# Patient Record
Sex: Female | Born: 1980 | Race: White | Hispanic: No | Marital: Married | Smoking: Never smoker
Health system: Southern US, Community
[De-identification: ages and names within clinical notes are randomized; demographics above are authoritative.]

## PROBLEM LIST (undated history)

## (undated) ENCOUNTER — Emergency Department (HOSPITAL_COMMUNITY): Discharge status: Absent without leave | Payer: 59

## (undated) DIAGNOSIS — Z8619 Personal history of other infectious and parasitic diseases: Secondary | ICD-10-CM

## (undated) DIAGNOSIS — Z789 Other specified health status: Secondary | ICD-10-CM

## (undated) DIAGNOSIS — O99119 Other diseases of the blood and blood-forming organs and certain disorders involving the immune mechanism complicating pregnancy, unspecified trimester: Secondary | ICD-10-CM

## (undated) DIAGNOSIS — Z905 Acquired absence of kidney: Secondary | ICD-10-CM

## (undated) DIAGNOSIS — D696 Thrombocytopenia, unspecified: Secondary | ICD-10-CM

## (undated) HISTORY — PX: NO PAST SURGERIES: SHX2092

## (undated) HISTORY — DX: Acquired absence of kidney: Z90.5

## (undated) HISTORY — DX: Personal history of other infectious and parasitic diseases: Z86.19

## (undated) HISTORY — DX: Other diseases of the blood and blood-forming organs and certain disorders involving the immune mechanism complicating pregnancy, unspecified trimester: O99.119

## (undated) HISTORY — DX: Thrombocytopenia, unspecified: D69.6

## (undated) HISTORY — PX: TONSILLECTOMY: SUR1361

---
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
## 2006-12-10 ENCOUNTER — Encounter: Admission: RE | Admit: 2006-12-10 | Discharge: 2006-12-10 | Payer: Self-pay | Admitting: Obstetrics and Gynecology

## 2009-07-31 ENCOUNTER — Inpatient Hospital Stay (HOSPITAL_COMMUNITY): Admission: AD | Admit: 2009-07-31 | Discharge: 2009-08-04 | Payer: Self-pay | Admitting: Obstetrics & Gynecology

## 2010-06-24 LAB — CBC
HCT: 30.5 % — ABNORMAL LOW (ref 36.0–46.0)
Hemoglobin: 10.5 g/dL — ABNORMAL LOW (ref 12.0–15.0)
Hemoglobin: 12.5 g/dL (ref 12.0–15.0)
MCHC: 34.7 g/dL (ref 30.0–36.0)
MCHC: 35.2 g/dL (ref 30.0–36.0)
MCV: 100.1 fL — ABNORMAL HIGH (ref 78.0–100.0)
MCV: 97.7 fL (ref 78.0–100.0)
MCV: 99.3 fL (ref 78.0–100.0)
Platelets: 90 10*3/uL — ABNORMAL LOW (ref 150–400)
Platelets: 94 10*3/uL — ABNORMAL LOW (ref 150–400)
RBC: 3.05 MIL/uL — ABNORMAL LOW (ref 3.87–5.11)
RBC: 3.5 MIL/uL — ABNORMAL LOW (ref 3.87–5.11)
RDW: 12.5 % (ref 11.5–15.5)
RDW: 12.7 % (ref 11.5–15.5)
WBC: 12.6 10*3/uL — ABNORMAL HIGH (ref 4.0–10.5)
WBC: 12.8 10*3/uL — ABNORMAL HIGH (ref 4.0–10.5)
WBC: 12.9 10*3/uL — ABNORMAL HIGH (ref 4.0–10.5)

## 2010-06-24 LAB — URINALYSIS, DIPSTICK ONLY
Hgb urine dipstick: NEGATIVE
Leukocytes, UA: NEGATIVE
Nitrite: NEGATIVE
Specific Gravity, Urine: 1.01 (ref 1.005–1.030)
Urobilinogen, UA: 0.2 mg/dL (ref 0.0–1.0)

## 2010-11-24 ENCOUNTER — Other Ambulatory Visit: Payer: Self-pay

## 2010-11-24 LAB — ANTIBODY SCREEN: Antibody Screen: NEGATIVE

## 2010-11-24 LAB — CBC: Platelets: 194 10*3/uL (ref 150–399)

## 2010-11-24 LAB — GC/CHLAMYDIA PROBE AMP, GENITAL: Chlamydia: NEGATIVE

## 2010-12-11 ENCOUNTER — Other Ambulatory Visit: Payer: Self-pay

## 2011-02-05 ENCOUNTER — Other Ambulatory Visit (HOSPITAL_COMMUNITY): Payer: Self-pay | Admitting: Obstetrics & Gynecology

## 2011-02-05 DIAGNOSIS — IMO0002 Reserved for concepts with insufficient information to code with codable children: Secondary | ICD-10-CM

## 2011-02-06 ENCOUNTER — Encounter (HOSPITAL_COMMUNITY): Payer: Self-pay

## 2011-02-06 ENCOUNTER — Ambulatory Visit (HOSPITAL_COMMUNITY)
Admission: RE | Admit: 2011-02-06 | Discharge: 2011-02-06 | Disposition: A | Discharge status: Home / Self Care | Payer: BC Managed Care – PPO | Source: Ambulatory Visit | Attending: Obstetrics & Gynecology | Admitting: Obstetrics & Gynecology

## 2011-02-06 DIAGNOSIS — Z1389 Encounter for screening for other disorder: Secondary | ICD-10-CM | POA: Insufficient documentation

## 2011-02-06 DIAGNOSIS — O3500X0 Maternal care for (suspected) central nervous system malformation or damage in fetus, unspecified, not applicable or unspecified: Secondary | ICD-10-CM | POA: Insufficient documentation

## 2011-02-06 DIAGNOSIS — O358XX0 Maternal care for other (suspected) fetal abnormality and damage, not applicable or unspecified: Secondary | ICD-10-CM | POA: Insufficient documentation

## 2011-02-06 DIAGNOSIS — Z363 Encounter for antenatal screening for malformations: Secondary | ICD-10-CM | POA: Insufficient documentation

## 2011-02-06 DIAGNOSIS — IMO0002 Reserved for concepts with insufficient information to code with codable children: Secondary | ICD-10-CM

## 2011-02-06 DIAGNOSIS — O350XX0 Maternal care for (suspected) central nervous system malformation in fetus, not applicable or unspecified: Secondary | ICD-10-CM | POA: Insufficient documentation

## 2011-04-07 NOTE — L&D Delivery Note (Signed)
Delivery Note At 3:56 PM a viable and healthy female was delivered via Vaginal, Spontaneous Delivery (Presentation: Right Occiput Anterior).  APGAR: 9, 9; weight  7 lb 8 oz.  Placenta status: Intact, Spontaneous.  Cord: 3 vessels, loose nuchal cord, reduced before head delivery. complications: None.  Cord pH: none needed.   Anesthesia: Epidural  Episiotomy: None  Lacerations: 1st degree;Vaginal Suture Repair: 3.0 vicryl rapide Est. Blood Loss (mL): 300 cc  Mom to postpartum.  Baby to with mother.  Cynara Tatham R 06/28/2011, 4:26 PM

## 2011-04-09 ENCOUNTER — Ambulatory Visit (HOSPITAL_COMMUNITY): Payer: BC Managed Care – PPO

## 2011-04-15 ENCOUNTER — Encounter: Payer: BC Managed Care – PPO | Attending: Obstetrics & Gynecology

## 2011-04-15 DIAGNOSIS — O99810 Abnormal glucose complicating pregnancy: Secondary | ICD-10-CM | POA: Insufficient documentation

## 2011-04-15 DIAGNOSIS — Z713 Dietary counseling and surveillance: Secondary | ICD-10-CM | POA: Insufficient documentation

## 2011-04-19 NOTE — Progress Notes (Signed)
  Patient was seen on 04/15/2011 for Gestational Diabetes self-management class at the [facility]. The following learning objectives were met by the patient during this course:   States the definition of Gestational Diabetes  States why dietary management is important in controlling blood glucose  Describes the effects each nutrient has on blood glucose levels  Demonstrates ability to create a balanced meal plan  Demonstrates carbohydrate counting   States when to check blood glucose levels  Demonstrates proper blood glucose monitoring techniques  States the effect of stress and exercise on blood glucose levels  States the importance of limiting caffeine and abstaining from alcohol and smoking  Blood glucose monitor given: One Touch Ultra Mini Meter Kit Lot # [id number] X Exp: 07/2011 Blood glucose reading: 91 mg/dl  Patient instructed to monitor glucose levels: FBS: 60 - <90 1 hour: <140 2 hour: <120  Patient received handouts:  Nutrition Diabetes and Pregnancy  Carbohydrate Counting List  Patient will be seen for follow-up as needed.

## 2011-04-19 NOTE — Patient Instructions (Signed)
Goals:  Check glucose levels per MD as instructed  Follow Gestational Diabetes Diet as instructed  Call for follow-up as needed    

## 2011-06-04 LAB — STREP B DNA PROBE: GBS: NEGATIVE

## 2011-06-28 ENCOUNTER — Inpatient Hospital Stay (HOSPITAL_COMMUNITY): Payer: BC Managed Care – PPO | Admitting: Anesthesiology

## 2011-06-28 ENCOUNTER — Encounter (HOSPITAL_COMMUNITY): Payer: Self-pay | Admitting: Obstetrics and Gynecology

## 2011-06-28 ENCOUNTER — Encounter (HOSPITAL_COMMUNITY): Payer: Self-pay | Admitting: Anesthesiology

## 2011-06-28 ENCOUNTER — Inpatient Hospital Stay (HOSPITAL_COMMUNITY)
Admission: AD | Admit: 2011-06-28 | Discharge: 2011-06-30 | DRG: 372 | Disposition: A | Discharge status: Home / Self Care | Payer: BC Managed Care – PPO | Source: Ambulatory Visit | Attending: Obstetrics and Gynecology | Admitting: Obstetrics and Gynecology

## 2011-06-28 DIAGNOSIS — O9912 Other diseases of the blood and blood-forming organs and certain disorders involving the immune mechanism complicating childbirth: Secondary | ICD-10-CM | POA: Diagnosis present

## 2011-06-28 DIAGNOSIS — D689 Coagulation defect, unspecified: Secondary | ICD-10-CM | POA: Diagnosis present

## 2011-06-28 DIAGNOSIS — O99119 Other diseases of the blood and blood-forming organs and certain disorders involving the immune mechanism complicating pregnancy, unspecified trimester: Secondary | ICD-10-CM | POA: Diagnosis present

## 2011-06-28 DIAGNOSIS — O24410 Gestational diabetes mellitus in pregnancy, diet controlled: Secondary | ICD-10-CM | POA: Diagnosis present

## 2011-06-28 DIAGNOSIS — O99814 Abnormal glucose complicating childbirth: Principal | ICD-10-CM | POA: Diagnosis present

## 2011-06-28 DIAGNOSIS — IMO0001 Reserved for inherently not codable concepts without codable children: Secondary | ICD-10-CM

## 2011-06-28 DIAGNOSIS — D696 Thrombocytopenia, unspecified: Secondary | ICD-10-CM | POA: Diagnosis present

## 2011-06-28 HISTORY — DX: Other specified health status: Z78.9

## 2011-06-28 LAB — CBC
HCT: 40 % (ref 36.0–46.0)
HCT: 36.7 % (ref 36.0–46.0)
Hemoglobin: 13.5 g/dL (ref 12.0–15.0)
MCH: 33 pg (ref 26.0–34.0)
MCHC: 33.8 g/dL (ref 30.0–36.0)
RBC: 3.78 MIL/uL — ABNORMAL LOW (ref 3.87–5.11)
RDW: 13.4 % (ref 11.5–15.5)
WBC: 20.2 10*3/uL — ABNORMAL HIGH (ref 4.0–10.5)

## 2011-06-28 LAB — ABO/RH: ABO/RH(D): O POS

## 2011-06-28 MED ORDER — OXYCODONE-ACETAMINOPHEN 5-325 MG PO TABS: 1.0000 | ORAL_TABLET | ORAL | Status: DC | PRN

## 2011-06-28 MED ORDER — BENZOCAINE-MENTHOL 20-0.5 % EX AERO: 1.0000 "application " | INHALATION_SPRAY | CUTANEOUS | Status: DC | PRN

## 2011-06-28 MED ORDER — ZOLPIDEM TARTRATE 5 MG PO TABS: 5.0000 mg | ORAL_TABLET | Freq: Every evening | ORAL | Status: DC | PRN

## 2011-06-28 MED ORDER — IBUPROFEN 600 MG PO TABS
600.0000 mg | ORAL_TABLET | Freq: Four times a day (QID) | ORAL | Status: DC
  Administered 2011-06-28 – 2011-06-29 (×3): 600 mg via ORAL
  Filled 2011-06-28 (×3): qty 1

## 2011-06-28 MED ORDER — LANOLIN HYDROUS EX OINT: TOPICAL_OINTMENT | CUTANEOUS | Status: DC | PRN

## 2011-06-28 MED ORDER — SIMETHICONE 80 MG PO CHEW: 80.0000 mg | CHEWABLE_TABLET | ORAL | Status: DC | PRN

## 2011-06-28 MED ORDER — OXYTOCIN 20 UNITS IN LACTATED RINGERS INFUSION - SIMPLE
125.0000 mL/h | Freq: Once | INTRAVENOUS | Status: AC
  Administered 2011-06-28: 125 mL/h via INTRAVENOUS

## 2011-06-28 MED ORDER — IBUPROFEN 600 MG PO TABS: 600.0000 mg | ORAL_TABLET | Freq: Four times a day (QID) | ORAL | Status: DC | PRN

## 2011-06-28 MED ORDER — TETANUS-DIPHTH-ACELL PERTUSSIS 5-2.5-18.5 LF-MCG/0.5 IM SUSP
0.5000 mL | Freq: Once | INTRAMUSCULAR | Status: AC
  Administered 2011-06-29: 0.5 mL via INTRAMUSCULAR
  Filled 2011-06-28: qty 0.5

## 2011-06-28 MED ORDER — CITRIC ACID-SODIUM CITRATE 334-500 MG/5ML PO SOLN: 30.0000 mL | ORAL | Status: DC | PRN

## 2011-06-28 MED ORDER — EPHEDRINE 5 MG/ML INJ
10.0000 mg | INTRAVENOUS | Status: DC | PRN
  Filled 2011-06-28: qty 4

## 2011-06-28 MED ORDER — LACTATED RINGERS IV SOLN: 500.0000 mL | Freq: Once | INTRAVENOUS | Status: DC

## 2011-06-28 MED ORDER — ONDANSETRON HCL 4 MG/2ML IJ SOLN: 4.0000 mg | INTRAMUSCULAR | Status: DC | PRN

## 2011-06-28 MED ORDER — LACTATED RINGERS IV SOLN: 500.0000 mL | INTRAVENOUS | Status: DC | PRN

## 2011-06-28 MED ORDER — PHENYLEPHRINE 40 MCG/ML (10ML) SYRINGE FOR IV PUSH (FOR BLOOD PRESSURE SUPPORT)
80.0000 ug | PREFILLED_SYRINGE | INTRAVENOUS | Status: DC | PRN
  Filled 2011-06-28: qty 5

## 2011-06-28 MED ORDER — DIPHENHYDRAMINE HCL 25 MG PO CAPS: 25.0000 mg | ORAL_CAPSULE | Freq: Four times a day (QID) | ORAL | Status: DC | PRN

## 2011-06-28 MED ORDER — OXYTOCIN BOLUS FROM INFUSION
500.0000 mL | Freq: Once | INTRAVENOUS | Status: DC
  Filled 2011-06-28: qty 1000
  Filled 2011-06-28: qty 500

## 2011-06-28 MED ORDER — DIPHENHYDRAMINE HCL 50 MG/ML IJ SOLN: 12.5000 mg | INTRAMUSCULAR | Status: DC | PRN

## 2011-06-28 MED ORDER — ONDANSETRON HCL 4 MG/2ML IJ SOLN: 4.0000 mg | Freq: Four times a day (QID) | INTRAMUSCULAR | Status: DC | PRN

## 2011-06-28 MED ORDER — ACETAMINOPHEN 325 MG PO TABS: 650.0000 mg | ORAL_TABLET | ORAL | Status: DC | PRN

## 2011-06-28 MED ORDER — WITCH HAZEL-GLYCERIN EX PADS: 1.0000 "application " | MEDICATED_PAD | CUTANEOUS | Status: DC | PRN

## 2011-06-28 MED ORDER — SENNOSIDES-DOCUSATE SODIUM 8.6-50 MG PO TABS
2.0000 | ORAL_TABLET | Freq: Every day | ORAL | Status: DC
  Administered 2011-06-28 – 2011-06-29 (×2): 2 via ORAL

## 2011-06-28 MED ORDER — EPHEDRINE 5 MG/ML INJ: 10.0000 mg | INTRAVENOUS | Status: DC | PRN

## 2011-06-28 MED ORDER — DIBUCAINE 1 % RE OINT: 1.0000 "application " | TOPICAL_OINTMENT | RECTAL | Status: DC | PRN

## 2011-06-28 MED ORDER — LIDOCAINE HCL (PF) 1 % IJ SOLN: 30.0000 mL | INTRAMUSCULAR | Status: DC | PRN

## 2011-06-28 MED ORDER — ONDANSETRON HCL 4 MG PO TABS: 4.0000 mg | ORAL_TABLET | ORAL | Status: DC | PRN

## 2011-06-28 MED ORDER — FENTANYL 2.5 MCG/ML BUPIVACAINE 1/10 % EPIDURAL INFUSION (WH - ANES)
14.0000 mL/h | INTRAMUSCULAR | Status: DC
  Filled 2011-06-28: qty 60

## 2011-06-28 MED ORDER — PRENATAL MULTIVITAMIN CH
1.0000 | ORAL_TABLET | Freq: Every day | ORAL | Status: DC
  Administered 2011-06-29 – 2011-06-30 (×2): 1 via ORAL
  Filled 2011-06-28 (×2): qty 1

## 2011-06-28 MED ORDER — SODIUM BICARBONATE 8.4 % IV SOLN
INTRAVENOUS | Status: DC | PRN
  Administered 2011-06-28: 4 mL via EPIDURAL

## 2011-06-28 MED ORDER — LACTATED RINGERS IV SOLN
INTRAVENOUS | Status: DC
  Administered 2011-06-28 (×2): via INTRAVENOUS

## 2011-06-28 MED ORDER — FENTANYL 2.5 MCG/ML BUPIVACAINE 1/10 % EPIDURAL INFUSION (WH - ANES)
INTRAMUSCULAR | Status: DC | PRN
  Administered 2011-06-28: 14 mL/h via EPIDURAL

## 2011-06-28 MED ORDER — PHENYLEPHRINE 40 MCG/ML (10ML) SYRINGE FOR IV PUSH (FOR BLOOD PRESSURE SUPPORT): 80.0000 ug | PREFILLED_SYRINGE | INTRAVENOUS | Status: DC | PRN

## 2011-06-28 MED ORDER — FLEET ENEMA 7-19 GM/118ML RE ENEM: 1.0000 | ENEMA | RECTAL | Status: DC | PRN

## 2011-06-28 NOTE — Anesthesia Procedure Notes (Signed)

## 2011-06-28 NOTE — H&P (Signed)
Nancy Colon is a 31 y.o. female presenting for Active labor, no loss of fluid or bleeding.. Maternal Medical History:  Reason for admission: Reason for admission: contractions.  Contractions: Perceived severity is moderate.    Fetal activity: Perceived fetal activity is normal.    Prenatal complications: Thrombocytopenia.   Prenatal Complications - Diabetes: gestational. Diabetes is managed by diet.     [facility] at [facility] started at 8 wks with Dr Juliene Pina. Good wt gain, GDM, well controlled, sono with good interval growth, last on 06/19/11 EFW 6'15" Prior [place]- uncomplicated SVD, has Gest.thromobocytopenia (90K) was able to get epidural. 7+ lbs, girl. PMHx- neg, KNDAs, non smoker/illicits.  .  OB History    Grav Para Term Preterm Abortions TAB SAB Ect Mult Living   2 1 1  0 0 0 0 0 0 1     Past Medical History  Diagnosis Date  . No pertinent past medical history    Past Surgical History  Procedure Date  . Tonsillectomy   . No past surgeries    Family History: family history is not on file. Social History:  reports that she has never smoked. She has never used smokeless tobacco. She reports that she does not drink alcohol or use illicit drugs.  Review of Systems  Constitutional: Negative for fever.  Eyes: Negative for blurred vision.  Respiratory: Negative for shortness of breath.   Cardiovascular: Negative for chest pain.  Neurological: Negative for headaches.  Psychiatric/Behavioral: Negative for depression.    Dilation: 5 Effacement (%): 80 Station: -2 Exam by:: J. Rasch RN  Blood pressure 121/75, pulse 74, temperature 98.3 F (36.8 C), temperature source Oral, resp. rate 20, height 5\' 3"  (1.6 m), weight 58.968 kg (130 lb), last menstrual period 09/26/2010. Exam Physical Exam  Physical exam:  A&O x 3, no acute distress. Pleasant HEENT neg, no thyromegaly Lungs CTA bilat CV RRR, S1S2 normal Abdo soft, non tender, non acute Extr no edema/ tenderness Pelvic above FHT   140s/+ accels/ no decels/ mod variab. Category I Toco q 3 min.   Prenatal labs: ABO, Rh: O/Positive/-- (08/20 0000) Antibody: Negative (08/20 0000) Rubella: Immune (08/20 0000) RPR: Nonreactive (08/20 0000)  HBsAg: Negative (08/20 0000)  HIV: Non-reactive (08/20 0000)  GBS: Negative (02/28 0000)  3 hr GTT- abn, but excellent diet controlled GDM (A1).  Sono - 18 wk noted bilateral CPC and echogenic focus in RUQ, but resolved at 26 wk sono. Ultrascreen neg, AFP1 neg/   Assessment/Plan: 31 yo, G2P1001, 39.2/7 wks, active labor. Epidural desired. GBS neg. Admit, watch labor progress, AROM after epidural, platelet ct pending.    Nancy Colon R 06/28/2011, 12:12 PM

## 2011-06-28 NOTE — Anesthesia Preprocedure Evaluation (Signed)

## 2011-06-28 NOTE — Progress Notes (Signed)
Nancy Colon is a 31 y.o. G2P1001 at [redacted]w[redacted]d, well dated, here is active labor. A1GDM, well controlled. Thrombocytopenia, platelets 101, s/p epidural, working well.   Objective: BP 104/71  Pulse 89  Temp(Src) 98.3 F (36.8 C) (Oral)  Resp 20  Ht 5\' 3"  (1.6 m)  Wt 58.968 kg (130 lb)  BMI 23.03 kg/m2  SpO2 98%  LMP 09/26/2010    FHT:  FHR: 140 bpm, variability: moderate,  accelerations:  Present,  decelerations:  Absent UC:   none, regular, every SVE:   Dilation: 6 Effacement (%): 100 Station: -1;-2 Exam by:: Dr. Juliene Pina AROM, clear fluid.   Labs: Lab Results  Component Value Date   WBC 17.7* 06/28/2011   HGB 13.5 06/28/2011   HCT 40.0 06/28/2011   MCV 97.8 06/28/2011   PLT 101* 06/28/2011   Assessment / Plan: Spontaneous labor, progressing normally, EFW 7 lbs.  GDM- A1, do random BS now.  Labor: Progressing normally Fetal Wellbeing:  Category I Pain Control:  Epidural Anticipated MOD:  NSVD  Nancy Colon 06/28/2011, 2:24 PM

## 2011-06-28 NOTE — Anesthesia Postprocedure Evaluation (Signed)
  Anesthesia Post-op Note  Patient: Nancy Colon  Procedure(s) Performed: * No procedures listed *  Patient Location: PACU and Mother/Baby  Anesthesia Type: Epidural  Level of Consciousness: awake, alert  and oriented  Airway and Oxygen Therapy: Patient Spontanous Breathing   Post-op Assessment: Patient's Cardiovascular Status Stable and Respiratory Function Stable  Post-op Vital Signs: stable  Complications: No apparent anesthesia complications

## 2011-06-29 LAB — CBC
HCT: 32.6 % — ABNORMAL LOW (ref 36.0–46.0)
Hemoglobin: 10.8 g/dL — ABNORMAL LOW (ref 12.0–15.0)
MCH: 32.4 pg (ref 26.0–34.0)
RBC: 3.33 MIL/uL — ABNORMAL LOW (ref 3.87–5.11)

## 2011-06-29 NOTE — Progress Notes (Signed)
Spoke with [facility]. Epidural needs to be removed within six hours of CBC. RN will re-evaluate PLT level after 0500 CBC then notify anesthesia of level before removal of epidural. Pt notified and verbalized understanding.

## 2011-06-29 NOTE — Progress Notes (Signed)
Spoke with L&D nurse. She stated she spoke with anesthesia and ok to remove epidural. L&D will send a RN out to remove epidural.

## 2011-06-29 NOTE — Progress Notes (Addendum)
Patient ID: Nancy Colon, female   DOB: [birth year], 31 y.o.   MRN: [id number]  PPD 1 SVD  S:  Reports feeling well             Tolerating po/ No nausea or vomiting             Bleeding is moderate             Pain controlled withmotrin (stopped this am) and percocet             Up ad lib / ambulatory  Newborn breast feeding  / females newborn   O:  A & O x 3 NAD             VS: Blood pressure 98/63, pulse 66, temperature 98.1 F (36.7 C), temperature source Oral, resp. rate 18, height 5\' 3"  (1.6 m), weight 58.968 kg (130 lb), last menstrual period 09/26/2010, SpO2 98.00%, unknown if currently breastfeeding.   LABS: WBC/Hgb/Hct/Plts:  11.3/10.8/32.6/99 (03/25 0554)     Lungs: Clear and unlabored  Heart: regular rate and rhythm / no mumurs  Abdomen: soft, non-tender, non-distended              Fundus: firm, non-tender, U-1  Perineum: mild edema  Lochia: light  Extremities: no edema, no calf pain or tenderness    A: PPD # 1             Gestational thrombocytopenia   Doing well - stable status  P:  Routine post partum orders  NSAIDS held - recheck PLT tomorrow  Marlinda Mike 06/29/2011, 8:40 AM  MD note Reviewed, pt seen, agree with above note and plan. --V.Juliene Pina, Md

## 2011-06-30 LAB — CBC
HCT: 32.1 % — ABNORMAL LOW (ref 36.0–46.0)
Hemoglobin: 10.8 g/dL — ABNORMAL LOW (ref 12.0–15.0)
MCH: 33.1 pg (ref 26.0–34.0)
MCHC: 33.6 g/dL (ref 30.0–36.0)
MCV: 98.5 fL (ref 78.0–100.0)
Platelets: 99 10*3/uL — ABNORMAL LOW (ref 150–400)
RBC: 3.26 MIL/uL — ABNORMAL LOW (ref 3.87–5.11)
RDW: 13.5 % (ref 11.5–15.5)
WBC: 10.5 10*3/uL (ref 4.0–10.5)

## 2011-06-30 NOTE — Progress Notes (Signed)
Patient ID: Nancy Colon, female   DOB: 1980-09-14, 31 y.o.   MRN: [id number]  PPD 1 SVD  S:  Reports feeling well             Tolerating po/ No nausea or vomiting             Bleeding is moderate             Pain minimal, no meds needed             Up ad lib / ambulatory  Newborn breast feeding  / females newborn   O:  A & O x 3 NAD             VS: Blood pressure 99/64, pulse 67, temperature 98.1 F (36.7 C), temperature source Oral, resp. rate 18, height 5\' 3"  (1.6 m), weight 58.968 kg (130 lb), last menstrual period 09/26/2010, SpO2 98.00%, unknown if currently breastfeeding.   LABS:  Lab Results  Component Value Date   PLT 99* 06/30/2011       Lungs: Clear and unlabored  Heart: regular rate and rhythm / no mumurs  Abdomen: soft, non-tender, non-distended              Fundus: firm, non-tender, U-1  Perineum: mild edema  Lochia: light  Extremities: no edema, no calf pain or tenderness    A: PPD # 1             Gestational thrombocytopenia   Doing well - stable status  P:  Routine post partum orders  D/C home today  F/U platelet count at 6 wks PP  Bleeding precautions reviewed, no NSAIDs during PP  Plan reviewed w/ Dr. Reyne Dumas 06/30/2011, 9:01 AM

## 2011-06-30 NOTE — Discharge Summary (Signed)
Reviewed, plan discussed, agree with above note and plan, f/up 6 wks, bleeding precautions.

## 2011-06-30 NOTE — Discharge Summary (Signed)
Obstetric Discharge Summary Reason for Admission: onset of labor and gestational diabetes A1, gestational thrombocytopenia Prenatal Procedures: ultrasound Intrapartum Procedures: spontaneous vaginal delivery Postpartum Procedures: none Complications-Operative and Postpartum: 1st degree vaginal laceration Hemoglobin  Date Value Range Status  06/30/2011 10.8* 12.0-15.0 (g/dL) Final     HCT  Date Value Range Status  06/30/2011 32.1* 36.0-46.0 (%) Final    Physical Exam:  General: alert, cooperative and no distress Lochia: appropriate Uterine Fundus: firm Incision: healing well, no dehiscence DVT Evaluation: No evidence of DVT seen on physical exam. Negative Homan's sign.  Discharge Diagnoses: Term Pregnancy-delivered and gestational thrombocytopenia - stable  Discharge Information: Date: 06/30/2011 Activity: pelvic rest Diet: routine Medications: PNV Condition: stable Instructions: refer to practice specific booklet Discharge to: home Follow-up Information    Follow up with MODY,Nancy R, MD. Schedule an appointment as soon as possible for a visit in 6 weeks.   Contact information:   [address] [place] [address] [phone]          Newborn Data: Live born female  Birth Weight: 7 lb 8.3 oz (3410 g) APGAR: 9, 9  Home with mother.  Nancy Colon 06/30/2011, 9:05 AM

## 2012-08-03 IMAGING — US US OB DETAIL+14 WK
1 series · 14 of 28 positions shown · non-contrast
Comparison: none

[Series 1: us ob detail+14 wk · 0.19mm/px · 14 of 98 slices shown]
[im 4/98]
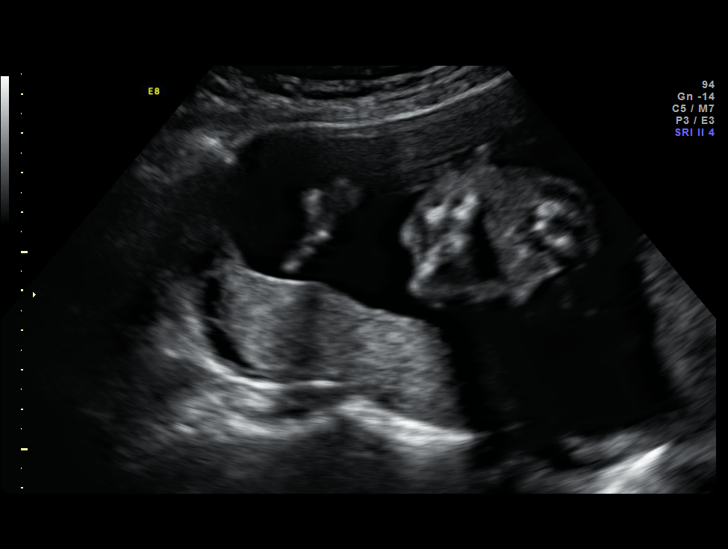
[im 11/98]
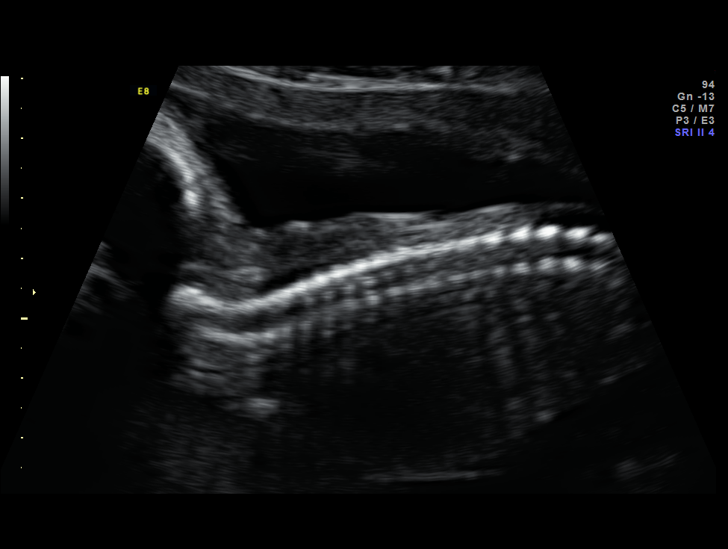
[im 18/98]
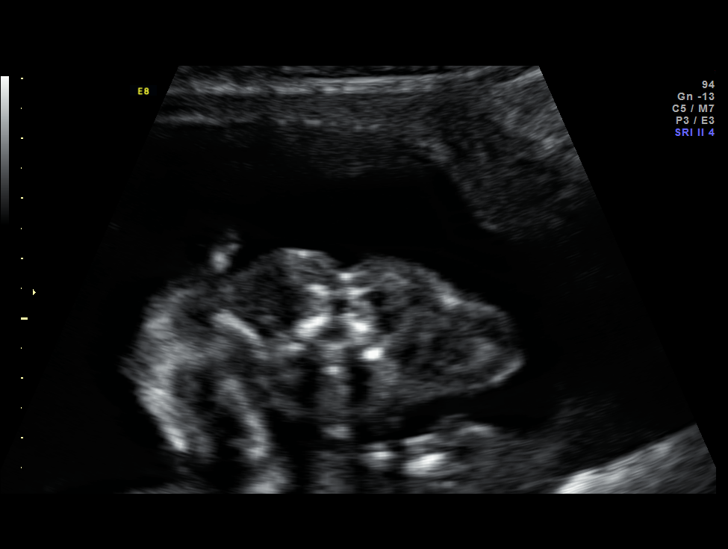
[im 26/98]
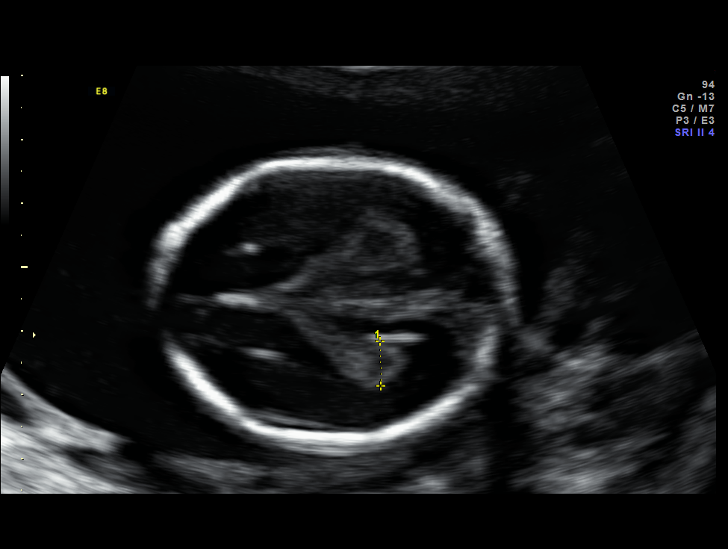
[im 33/98]
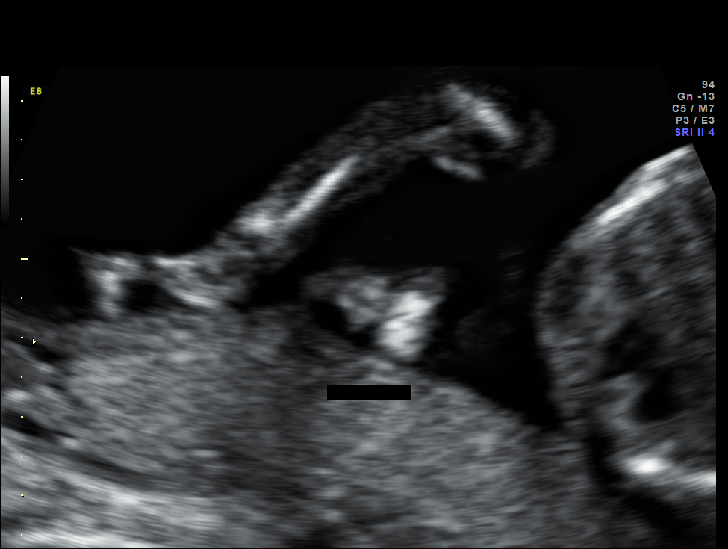
[im 40/98]
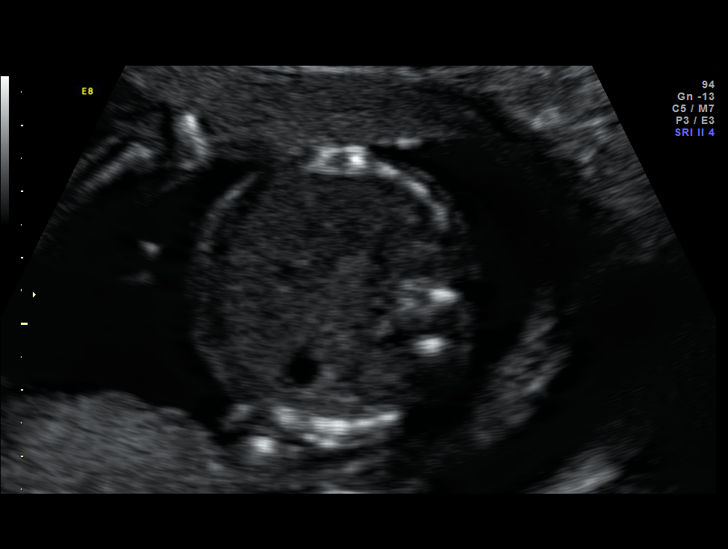
[im 47/98]
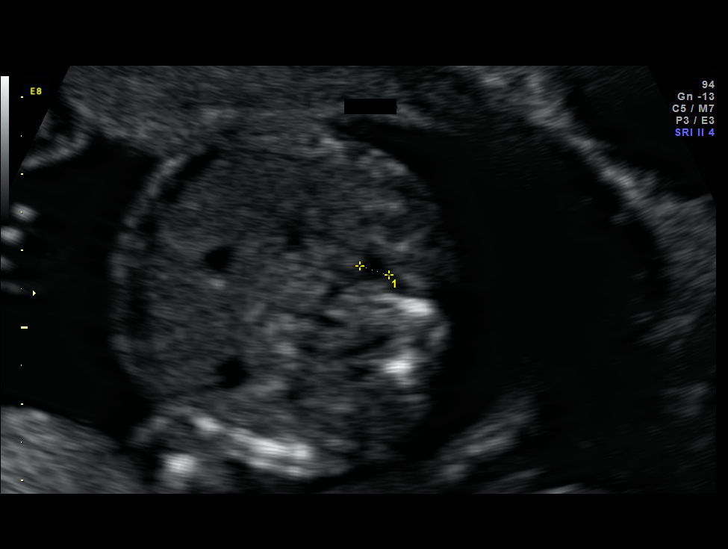
[im 54/98]
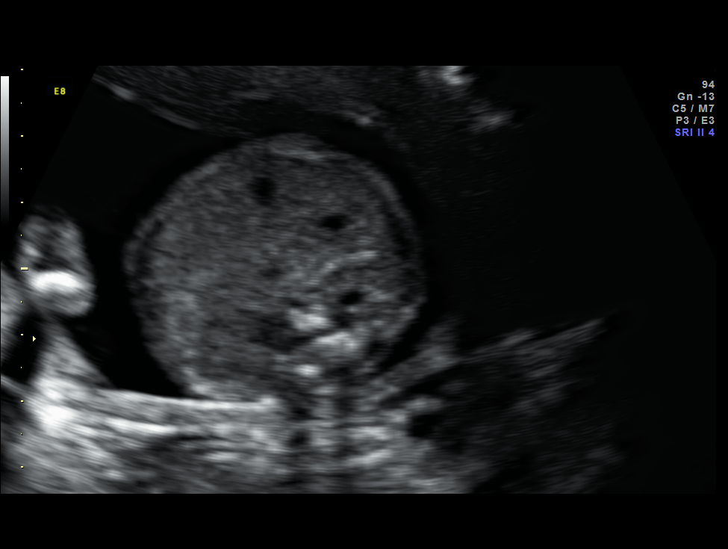
[im 62/98]
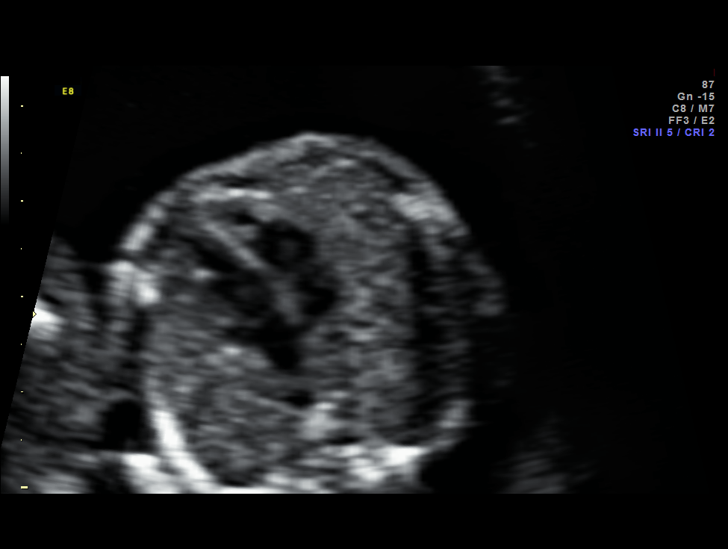
[im 69/98]
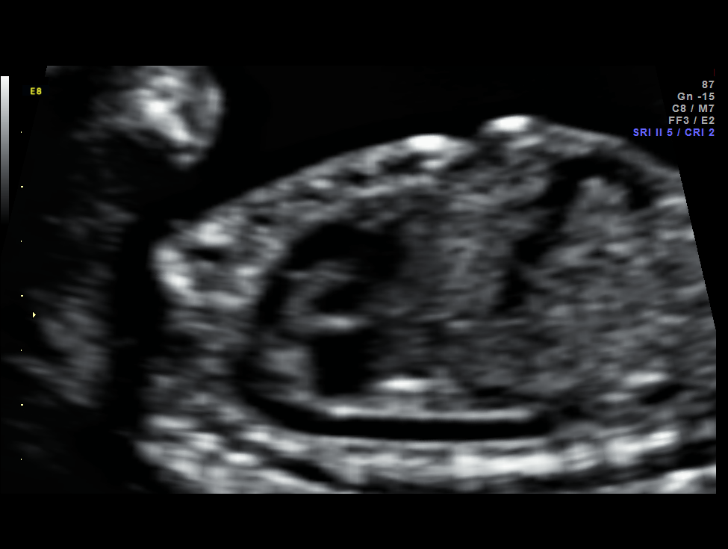
[im 76/98]
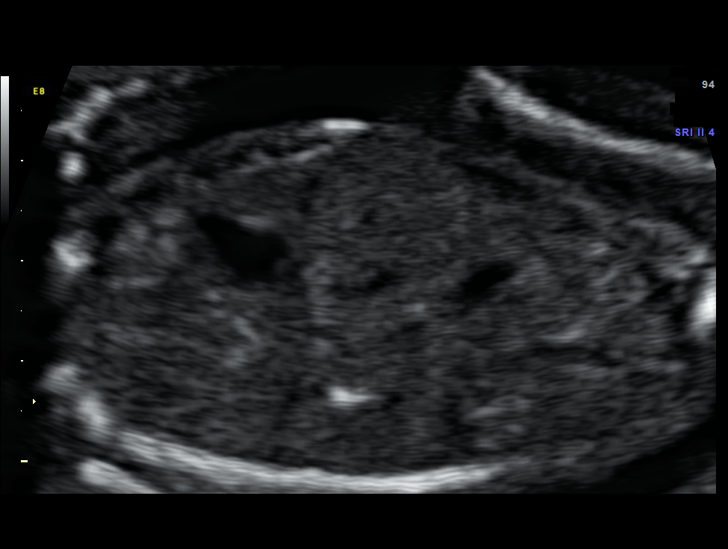
[im 83/98]
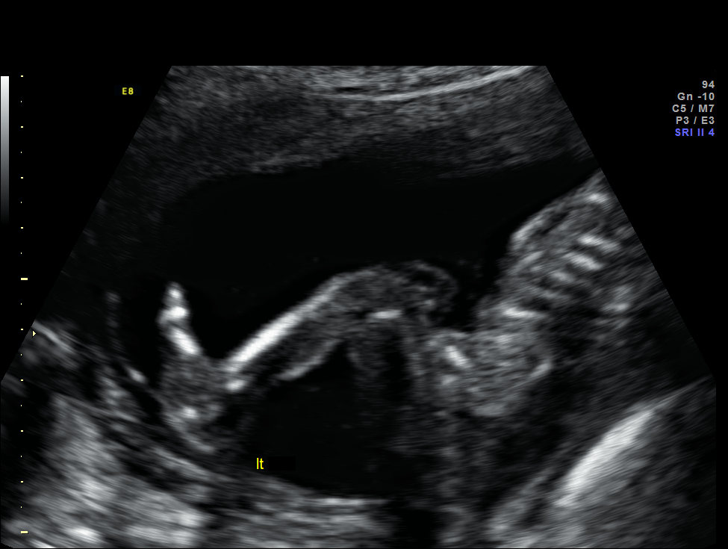
[im 90/98]
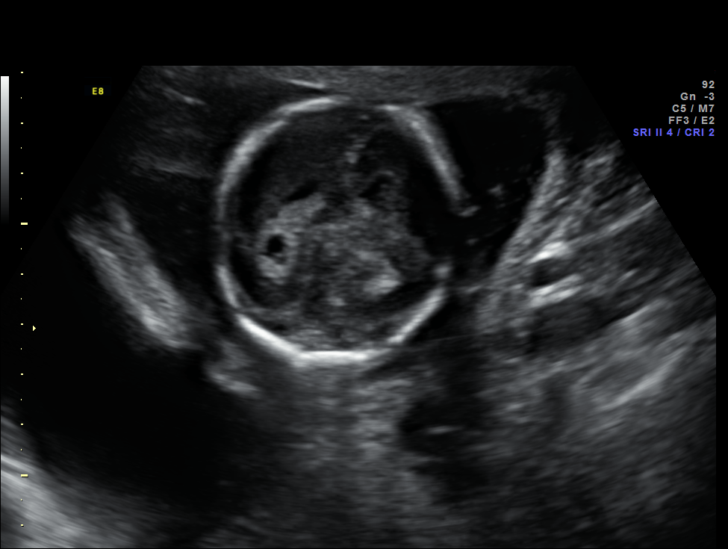
[im 98/98]
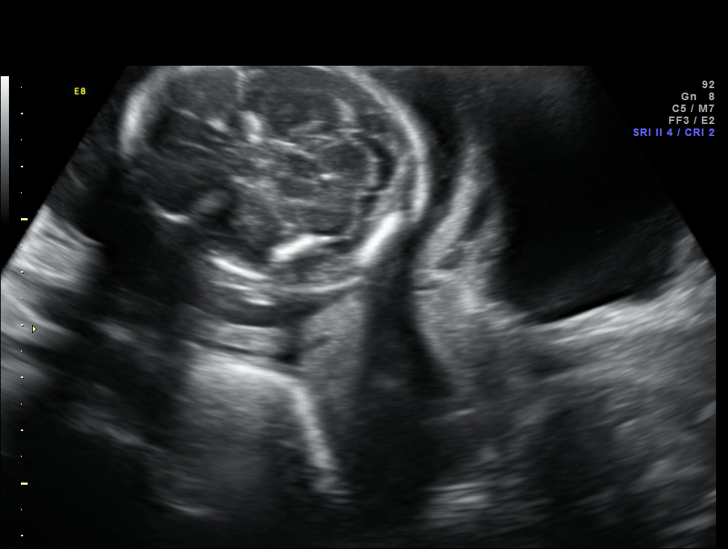

[14 of 28 positions shown; findings below may reference images not displayed]

Canned report from images found in remote index.

Refer to host system for actual result text.

## 2014-02-05 ENCOUNTER — Encounter (HOSPITAL_COMMUNITY): Payer: Self-pay | Admitting: Obstetrics and Gynecology

## 2014-04-06 NOTE — L&D Delivery Note (Signed)
Delivery Note Before pushing, bedside sono performed. Noted Twin B pushed up in Complete breech position. Decision was made to proceed with vaginal deliveries in Labor room and mother counseled on Complete breech extraction of twin B. She gave verbal consent.    Kai, Railsback [[id number]]  At 6:35 PM a viable and healthy female was delivered via Vaginal, Spontaneous Delivery (Presentation: ; Occiput Posterior).  APGAR: 9, 9; weight -pending .    Twin B evaluation noted vertex at the fundus. Vaginal exam revealed baby in Complete breech position but above the inlet. One foot was grasped through intact amniotic bag and brought down to the vagina. Amniotomy done, thin meconium stained fluid released. One leg delivered out and other leg grasped and brought baby down with back up upto scapulas, then arms reduced over the body from front and then head delivered with gentle flexion in smooth controlled fashion.    Millette, [place] [[id number]]  At 6:38 PM a viable and healthy female was delivered via Complete Breech extraction  (Presentation: complete breech).  APGAR: 8, 9; weight  -pending.    Placenta status: intact .  Cord:  with the following complications: none. Anesthesia:  Epidural  Episiotomy:  None Lacerations:  None Est. Blood Loss (mL): 300   Mom to postpartum.   Baby A to Couplet care / Skin to Skin.   Baby B to Couplet care / Skin to Skin.  Sara Keys R 01/20/2015, 7:02 PM

## 2014-07-03 LAB — OB RESULTS CONSOLE GC/CHLAMYDIA
Chlamydia: NEGATIVE
Gonorrhea: NEGATIVE

## 2014-07-03 LAB — OB RESULTS CONSOLE HEPATITIS B SURFACE ANTIGEN: HEP B S AG: NEGATIVE

## 2014-07-03 LAB — OB RESULTS CONSOLE RPR: RPR: NONREACTIVE

## 2014-07-03 LAB — OB RESULTS CONSOLE HIV ANTIBODY (ROUTINE TESTING): HIV: NONREACTIVE

## 2014-07-03 LAB — OB RESULTS CONSOLE RUBELLA ANTIBODY, IGM: RUBELLA: IMMUNE

## 2014-07-03 LAB — OB RESULTS CONSOLE ABO/RH: RH Type: POSITIVE

## 2014-07-03 LAB — OB RESULTS CONSOLE ANTIBODY SCREEN: Antibody Screen: NEGATIVE

## 2014-09-18 ENCOUNTER — Other Ambulatory Visit (HOSPITAL_COMMUNITY): Payer: Self-pay | Admitting: Obstetrics & Gynecology

## 2014-09-18 DIAGNOSIS — IMO0002 Reserved for concepts with insufficient information to code with codable children: Secondary | ICD-10-CM

## 2014-09-18 DIAGNOSIS — Z0489 Encounter for examination and observation for other specified reasons: Secondary | ICD-10-CM

## 2014-09-21 ENCOUNTER — Ambulatory Visit (HOSPITAL_COMMUNITY)
Admission: RE | Admit: 2014-09-21 | Discharge: 2014-09-21 | Disposition: A | Discharge status: Home / Self Care | Payer: 59 | Source: Ambulatory Visit | Attending: Obstetrics & Gynecology | Admitting: Obstetrics & Gynecology

## 2014-09-21 ENCOUNTER — Encounter (HOSPITAL_COMMUNITY): Payer: Self-pay

## 2014-09-21 DIAGNOSIS — O30042 Twin pregnancy, dichorionic/diamniotic, second trimester: Secondary | ICD-10-CM | POA: Insufficient documentation

## 2014-09-21 DIAGNOSIS — Z36 Encounter for antenatal screening of mother: Secondary | ICD-10-CM | POA: Diagnosis present

## 2014-09-21 DIAGNOSIS — O358XX0 Maternal care for other (suspected) fetal abnormality and damage, not applicable or unspecified: Secondary | ICD-10-CM | POA: Diagnosis not present

## 2014-09-21 DIAGNOSIS — Z3A20 20 weeks gestation of pregnancy: Secondary | ICD-10-CM | POA: Insufficient documentation

## 2014-09-21 DIAGNOSIS — Z0489 Encounter for examination and observation for other specified reasons: Secondary | ICD-10-CM

## 2014-09-21 DIAGNOSIS — IMO0002 Reserved for concepts with insufficient information to code with codable children: Secondary | ICD-10-CM

## 2014-11-02 ENCOUNTER — Other Ambulatory Visit (HOSPITAL_COMMUNITY): Payer: Self-pay | Admitting: Obstetrics & Gynecology

## 2014-12-25 ENCOUNTER — Other Ambulatory Visit (HOSPITAL_BASED_OUTPATIENT_CLINIC_OR_DEPARTMENT_OTHER): Payer: 59

## 2014-12-25 ENCOUNTER — Encounter: Payer: Self-pay | Admitting: Hematology & Oncology

## 2014-12-25 ENCOUNTER — Ambulatory Visit (HOSPITAL_BASED_OUTPATIENT_CLINIC_OR_DEPARTMENT_OTHER): Payer: 59 | Admitting: Hematology & Oncology

## 2014-12-25 ENCOUNTER — Ambulatory Visit: Payer: 59

## 2014-12-25 VITALS — BP 113/64 | HR 72 | Temp 97.9°F | Resp 18 | Ht 63.0 in | Wt 142.0 lb

## 2014-12-25 DIAGNOSIS — O99113 Other diseases of the blood and blood-forming organs and certain disorders involving the immune mechanism complicating pregnancy, third trimester: Secondary | ICD-10-CM

## 2014-12-25 DIAGNOSIS — D696 Thrombocytopenia, unspecified: Secondary | ICD-10-CM

## 2014-12-25 DIAGNOSIS — D229 Melanocytic nevi, unspecified: Secondary | ICD-10-CM

## 2014-12-25 LAB — COMPREHENSIVE METABOLIC PANEL (CC13)
ALBUMIN: 2.9 g/dL — AB (ref 3.5–5.0)
ALK PHOS: 142 U/L (ref 40–150)
ALT: 10 U/L (ref 0–55)
AST: 20 U/L (ref 5–34)
Anion Gap: 8 mEq/L (ref 3–11)
BILIRUBIN TOTAL: 0.22 mg/dL (ref 0.20–1.20)
BUN: 9.3 mg/dL (ref 7.0–26.0)
CALCIUM: 8.6 mg/dL (ref 8.4–10.4)
CO2: 25 mEq/L (ref 22–29)
CREATININE: 0.7 mg/dL (ref 0.6–1.1)
Chloride: 107 mEq/L (ref 98–109)
EGFR: >90 mL/min/{1.73_m2} (ref >90)
GLUCOSE: 79 mg/dL (ref 70–140)
Potassium: 3.9 mEq/L (ref 3.5–5.1)
SODIUM: 140 meq/L (ref 136–145)
TOTAL PROTEIN: 5.8 g/dL — AB (ref 6.4–8.3)

## 2014-12-25 LAB — CBC WITH DIFFERENTIAL (CANCER CENTER ONLY)
BASO#: 0 10*3/uL (ref 0.0–0.2)
BASO%: 0.3 % (ref 0.0–2.0)
EOS%: 0.5 % (ref 0.0–7.0)
Eosinophils Absolute: 0.1 10*3/uL (ref 0.0–0.5)
HEMATOCRIT: 34.9 % (ref 34.8–46.6)
HEMOGLOBIN: 12 g/dL (ref 11.6–15.9)
LYMPH#: 1.4 10*3/uL (ref 0.9–3.3)
LYMPH%: 14.6 % (ref 14.0–48.0)
MCH: 34 pg (ref 26.0–34.0)
MCHC: 34.4 g/dL (ref 32.0–36.0)
MCV: 99 fL (ref 81–101)
MONO#: 0.9 10*3/uL (ref 0.1–0.9)
MONO%: 9.6 % (ref 0.0–13.0)
NEUT%: 75 % (ref 39.6–80.0)
NEUTROS ABS: 7.1 10*3/uL — AB (ref 1.5–6.5)
Platelets: 72 10*3/uL — ABNORMAL LOW (ref 145–400)
RBC: 3.53 10*6/uL — ABNORMAL LOW (ref 3.70–5.32)
RDW: 13 % (ref 11.1–15.7)
WBC: 9.4 10*3/uL (ref 3.9–10.0)

## 2014-12-25 LAB — TECHNOLOGIST REVIEW CHCC SATELLITE

## 2014-12-25 LAB — CHCC SATELLITE - SMEAR

## 2014-12-25 MED ORDER — DEXAMETHASONE 4 MG PO TABS: ORAL_TABLET | ORAL | Status: DC

## 2014-12-25 NOTE — Progress Notes (Signed)
Referral MD  Reason for Referral: Gestational thrombocytopenia-twin pregnancy   Chief Complaint  Patient presents with  . OTHER    New Patient  : My platelets are low.  HPI: Nancy Colon is a very charming 34 year old white female. She is pregnant with twins. She will have a boy and a girl. I think she is due in 3 weeks. Past she's had 2 prior pregnancies. There are both full term. She apparently had platelets go down a little bit. However, there were not low enough that there was any concern. She says that her platelets went back up after her delivery.  She is followed by Dr. Benjie Karvonen and has had blood work done. Her platelet count has been going down.  In August, her platelet count was 90,000. She was not anemic.  She's had no bleeding. She's had no problems with her liver. She's had no leg swelling.  Of note, back in July, her platelet count was 112,000.  Because of the thrombocytopenia, she was kindly referred to the [facility] for an evaluation.  She says that because of her low platelets, she may not be able to have an epidural.  She says of the babies are developing normally.  She has had no issues with nausea or vomiting.  She's had no bruising.  She's had no past surgery.  She does not drink alcohol nor smoke cigarettes.  Overall, her performance status is ECOG 0.                  Past Medical History  Diagnosis Date  . No pertinent past medical history   :  Past Surgical History  Procedure Laterality Date  . Tonsillectomy    . No past surgeries    :   Current outpatient prescriptions:  .  Prenatal Vit-Fe Fumarate-FA (PRENATAL MULTIVITAMIN) TABS, Take 1 tablet by mouth daily., Disp: , Rfl:  .  dexamethasone (DECADRON) 4 MG tablet, Take 5 pills a day with food for 4 days. Start on September 21, Disp: 20 tablet, Rfl: 2:  :  Allergies  Allergen Reactions  . Sulfa Antibiotics Other (See Comments)    Happened in childhood  reaction unknown  :  No family history on file.:  Social History   Social History  . Marital Status: Married    Spouse Name: N/A  . Number of Children: N/A  . Years of Education: N/A   Occupational History  . Not on file.   Social History Main Topics  . Smoking status: Never Smoker   . Smokeless tobacco: Never Used  . Alcohol Use: No  . Drug Use: No  . Sexual Activity: Yes    Birth Control/ Protection: None   Other Topics Concern  . Not on file   Social History Narrative  :  Pertinent items are noted in HPI.  Exam: @IPVITALS @  pregnant white female in no obvious distress. Vital signs show temperature of 97.9. Pulse 72. Blood pressure 113/64. Weight is 142 pounds. Head and neck exam shows no ocular or oral lesions. There are no palpable cervical or supraclavicular lymph nodes. Lungs are clear. Cardiac exam regular rate and rhythm with no murmurs, rubs or bruits. Abdomen is pregnant. Abdomen shows no fluid wave. There is no obvious hepato-splenomegaly. Back exam shows no tenderness over the spine, ribs or hips. Extremities shows no clubbing, cyanosis or edema. Skin exam shows some hyperpigmented lesions on her anterior abdominal wall. Neurological exam shows no focal neurological deficits.  Recent Labs  12/25/14 1035  WBC 9.4  HGB 12.0  HCT 34.9  PLT 72*   No results for input(s): NA, K, CL, CO2, GLUCOSE, BUN, CREATININE, CALCIUM in the last 72 hours.  Blood smear review: Normochromic and normocytic population of red blood cells. She has no schistocytes or spherocytes. I see no rouleau formation. She has no target cells. There is no nucleated red blood cells. She has no Howell-Jolly bodies. White cells been normal in morphology maturation. She has no immature myeloid or lymphoid forms. She has no hypersegmented polys. Platelets are slightly decreased in number. She has several large platelets. Platelets are well granulated.  Pathology: None     Assessment and  Plan:  Nancy Colon is a very charming 34 year old pregnant female. She is her third trimester.  She has gestational thrombocytopenia from my point of view. Everything is consistent with thrombocytopenia. She's had thrombocytopenia with her past pregnancies. However, with 2 placentas, her platelet count has gone down more as a placentas take up the platelets.  I don't see anything on her blood smear deficits suggest any other etiology for the thrombocytopenia. I doubt that this is ITP. I don't see anything that suggest TTP or other hemolytic type process.  From my point of view, I think it is safe and studies have shown that it is safe to do an epidural with platelet count above 50,000. However, I realize that the anesthesiologist have legal obligations that they have to follow.  It is unclear as whether or not steroids will help her. However, I think that short course of steroids would not be about idea.  I gave her a prescription for Decadron to take 20 mg a day for 4 days. We will see if this gets her plan account up. I told her to start taking this tomorrow.  She has a labs done by Dr. Benjie Karvonen on Monday.  I don't see any downside to the short course of Decadron for her or for her babies.  I also feel that she pontes be seen by dermatologist. I think once she has her babies, she should go to a dermatologist. She has a couple suspicious hyperpigmented lesions on her anterior abdominal wall. I think they need to be looked at by dermatology. We'll see about making her a referral.  I spent a good hour with her. This is a low bit complicated.  I will be in touch with her obstetrician.

## 2014-12-26 ENCOUNTER — Telehealth: Payer: Self-pay | Admitting: Hematology & Oncology

## 2014-12-26 NOTE — Addendum Note (Signed)
Addended by: Volanda Napoleon on: 12/26/2014 09:36 AM   Modules accepted: Orders

## 2014-12-26 NOTE — Telephone Encounter (Signed)
Called patient's home #. L/m regarding patient's appt with Dr. Allyson Sabal on 02/12/2015 at 9:30am. Dr. Ledell Peoples office will call patient two days before appt.

## 2014-12-31 LAB — OB RESULTS CONSOLE GBS: GBS: NEGATIVE

## 2015-01-11 ENCOUNTER — Other Ambulatory Visit: Payer: Self-pay | Admitting: Obstetrics & Gynecology

## 2015-01-16 ENCOUNTER — Telehealth: Payer: Self-pay

## 2015-01-16 NOTE — Telephone Encounter (Signed)
Received call from Dr. Gardiner Coins nurse requesting Dr Marin Olp review pt's recent labwork, including platelet count of 70 and give recommendations if necessary. Pt is scheduled to be induced on Sunday, 10/16 with epidural.   Per Dr Marin Olp, pt can safely receive epidural and deliver with platelets of 50 and above. Published research indicating this standard faxed to [facility] at [phone]. dph

## 2015-01-17 ENCOUNTER — Encounter (HOSPITAL_COMMUNITY): Payer: Self-pay | Admitting: *Deleted

## 2015-01-17 ENCOUNTER — Telehealth (HOSPITAL_COMMUNITY): Payer: Self-pay | Admitting: *Deleted

## 2015-01-17 NOTE — Telephone Encounter (Signed)
Preadmission screen  

## 2015-01-20 ENCOUNTER — Inpatient Hospital Stay (HOSPITAL_COMMUNITY): Payer: 59 | Admitting: Anesthesiology

## 2015-01-20 ENCOUNTER — Inpatient Hospital Stay (HOSPITAL_COMMUNITY)
Admission: RE | Admit: 2015-01-20 | Discharge: 2015-01-22 | DRG: 775 | Disposition: A | Discharge status: Home / Self Care | Payer: 59 | Source: Ambulatory Visit | Attending: Obstetrics & Gynecology | Admitting: Obstetrics & Gynecology

## 2015-01-20 ENCOUNTER — Encounter (HOSPITAL_COMMUNITY): Payer: Self-pay

## 2015-01-20 DIAGNOSIS — O99119 Other diseases of the blood and blood-forming organs and certain disorders involving the immune mechanism complicating pregnancy, unspecified trimester: Secondary | ICD-10-CM | POA: Diagnosis present

## 2015-01-20 DIAGNOSIS — O30043 Twin pregnancy, dichorionic/diamniotic, third trimester: Secondary | ICD-10-CM | POA: Diagnosis present

## 2015-01-20 DIAGNOSIS — Z3A38 38 weeks gestation of pregnancy: Secondary | ICD-10-CM | POA: Diagnosis not present

## 2015-01-20 DIAGNOSIS — D696 Thrombocytopenia, unspecified: Secondary | ICD-10-CM | POA: Diagnosis present

## 2015-01-20 DIAGNOSIS — O9081 Anemia of the puerperium: Secondary | ICD-10-CM | POA: Diagnosis not present

## 2015-01-20 DIAGNOSIS — IMO0001 Reserved for inherently not codable concepts without codable children: Secondary | ICD-10-CM

## 2015-01-20 DIAGNOSIS — D62 Acute posthemorrhagic anemia: Secondary | ICD-10-CM | POA: Diagnosis not present

## 2015-01-20 DIAGNOSIS — O9912 Other diseases of the blood and blood-forming organs and certain disorders involving the immune mechanism complicating childbirth: Principal | ICD-10-CM | POA: Diagnosis present

## 2015-01-20 DIAGNOSIS — O321XX2 Maternal care for breech presentation, fetus 2: Secondary | ICD-10-CM | POA: Diagnosis present

## 2015-01-20 LAB — CBC
HCT: 37.4 % (ref 36.0–46.0)
HEMATOCRIT: 35.4 % — AB (ref 36.0–46.0)
Hemoglobin: 12.9 g/dL (ref 12.0–15.0)
Hemoglobin: 11.9 g/dL — ABNORMAL LOW (ref 12.0–15.0)
MCH: 33.7 pg (ref 26.0–34.0)
MCH: 33.1 pg (ref 26.0–34.0)
MCHC: 34.5 g/dL (ref 30.0–36.0)
MCHC: 33.6 g/dL (ref 30.0–36.0)
MCV: 97.7 fL (ref 78.0–100.0)
MCV: 98.6 fL (ref 78.0–100.0)
PLATELETS: 122 10*3/uL — AB (ref 150–400)
PLATELETS: 125 10*3/uL — AB (ref 150–400)
RBC: 3.83 MIL/uL — AB (ref 3.87–5.11)
RBC: 3.59 MIL/uL — ABNORMAL LOW (ref 3.87–5.11)
RDW: 13.8 % (ref 11.5–15.5)
RDW: 13.8 % (ref 11.5–15.5)
WBC: 11.2 10*3/uL — ABNORMAL HIGH (ref 4.0–10.5)
WBC: 12.2 10*3/uL — AB (ref 4.0–10.5)

## 2015-01-20 LAB — TYPE AND SCREEN
ABO/RH(D): O POS
Antibody Screen: NEGATIVE

## 2015-01-20 LAB — RPR: RPR: NONREACTIVE

## 2015-01-20 MED ORDER — WITCH HAZEL-GLYCERIN EX PADS: 1.0000 "application " | MEDICATED_PAD | CUTANEOUS | Status: DC | PRN

## 2015-01-20 MED ORDER — CITRIC ACID-SODIUM CITRATE 334-500 MG/5ML PO SOLN
30.0000 mL | ORAL | Status: DC | PRN
  Filled 2015-01-20: qty 15

## 2015-01-20 MED ORDER — ONDANSETRON HCL 4 MG/2ML IJ SOLN: 4.0000 mg | Freq: Four times a day (QID) | INTRAMUSCULAR | Status: DC | PRN

## 2015-01-20 MED ORDER — DIPHENHYDRAMINE HCL 50 MG/ML IJ SOLN: 12.5000 mg | INTRAMUSCULAR | Status: DC | PRN

## 2015-01-20 MED ORDER — SIMETHICONE 80 MG PO CHEW: 80.0000 mg | CHEWABLE_TABLET | ORAL | Status: DC | PRN

## 2015-01-20 MED ORDER — IBUPROFEN 600 MG PO TABS
600.0000 mg | ORAL_TABLET | Freq: Four times a day (QID) | ORAL | Status: DC
  Administered 2015-01-20 – 2015-01-21 (×2): 600 mg via ORAL
  Filled 2015-01-20 (×2): qty 1

## 2015-01-20 MED ORDER — OXYTOCIN 40 UNITS IN LACTATED RINGERS INFUSION - SIMPLE MED
1.0000 m[IU]/min | INTRAVENOUS | Status: DC
  Filled 2015-01-20: qty 1000

## 2015-01-20 MED ORDER — TETANUS-DIPHTH-ACELL PERTUSSIS 5-2.5-18.5 LF-MCG/0.5 IM SUSP: 0.5000 mL | Freq: Once | INTRAMUSCULAR | Status: DC

## 2015-01-20 MED ORDER — ONDANSETRON HCL 4 MG/2ML IJ SOLN: 4.0000 mg | INTRAMUSCULAR | Status: DC | PRN

## 2015-01-20 MED ORDER — SENNOSIDES-DOCUSATE SODIUM 8.6-50 MG PO TABS: 2.0000 | ORAL_TABLET | ORAL | Status: DC

## 2015-01-20 MED ORDER — OXYTOCIN 40 UNITS IN LACTATED RINGERS INFUSION - SIMPLE MED
1.0000 m[IU]/min | INTRAVENOUS | Status: DC
  Administered 2015-01-20: 2 m[IU]/min via INTRAVENOUS
  Filled 2015-01-20: qty 1000

## 2015-01-20 MED ORDER — LACTATED RINGERS IV SOLN
500.0000 mL | INTRAVENOUS | Status: DC | PRN
  Administered 2015-01-20: 500 mL via INTRAVENOUS

## 2015-01-20 MED ORDER — PHENYLEPHRINE 40 MCG/ML (10ML) SYRINGE FOR IV PUSH (FOR BLOOD PRESSURE SUPPORT)
80.0000 ug | PREFILLED_SYRINGE | INTRAVENOUS | Status: DC | PRN
  Filled 2015-01-20: qty 2
  Filled 2015-01-20: qty 20

## 2015-01-20 MED ORDER — ONDANSETRON HCL 4 MG PO TABS: 4.0000 mg | ORAL_TABLET | ORAL | Status: DC | PRN

## 2015-01-20 MED ORDER — LIDOCAINE HCL (PF) 1 % IJ SOLN
30.0000 mL | INTRAMUSCULAR | Status: DC | PRN
  Filled 2015-01-20: qty 30

## 2015-01-20 MED ORDER — OXYTOCIN 40 UNITS IN LACTATED RINGERS INFUSION - SIMPLE MED
62.5000 mL/h | Freq: Once | INTRAVENOUS | Status: DC | PRN
  Administered 2015-01-20: 62.5 mL/h via INTRAVENOUS

## 2015-01-20 MED ORDER — OXYTOCIN BOLUS FROM INFUSION
500.0000 mL | Freq: Once | INTRAVENOUS | Status: DC | PRN
  Administered 2015-01-20 (×2): 500 mL via INTRAVENOUS

## 2015-01-20 MED ORDER — TERBUTALINE SULFATE 1 MG/ML IJ SOLN
0.2500 mg | Freq: Once | INTRAMUSCULAR | Status: DC | PRN
  Filled 2015-01-20: qty 1

## 2015-01-20 MED ORDER — OXYCODONE-ACETAMINOPHEN 5-325 MG PO TABS: 2.0000 | ORAL_TABLET | ORAL | Status: DC | PRN

## 2015-01-20 MED ORDER — MISOPROSTOL 200 MCG PO TABS
ORAL_TABLET | ORAL | Status: AC
  Administered 2015-01-20: 1000 ug via RECTAL
  Filled 2015-01-20: qty 5

## 2015-01-20 MED ORDER — PRENATAL MULTIVITAMIN CH
1.0000 | ORAL_TABLET | Freq: Every day | ORAL | Status: DC
  Administered 2015-01-21 – 2015-01-22 (×2): 1 via ORAL
  Filled 2015-01-20 (×2): qty 1

## 2015-01-20 MED ORDER — FENTANYL 2.5 MCG/ML BUPIVACAINE 1/10 % EPIDURAL INFUSION (WH - ANES)
1.0000 mL/h | INTRAMUSCULAR | Status: DC | PRN
  Administered 2015-01-20: 2 mL/h via EPIDURAL
  Filled 2015-01-20: qty 125

## 2015-01-20 MED ORDER — LACTATED RINGERS IV SOLN
INTRAVENOUS | Status: DC
  Administered 2015-01-20 (×3): via INTRAVENOUS

## 2015-01-20 MED ORDER — ZOLPIDEM TARTRATE 5 MG PO TABS: 5.0000 mg | ORAL_TABLET | Freq: Every evening | ORAL | Status: DC | PRN

## 2015-01-20 MED ORDER — DIPHENHYDRAMINE HCL 25 MG PO CAPS: 25.0000 mg | ORAL_CAPSULE | Freq: Four times a day (QID) | ORAL | Status: DC | PRN

## 2015-01-20 MED ORDER — MISOPROSTOL 25 MCG QUARTER TABLET
25.0000 ug | ORAL_TABLET | ORAL | Status: DC | PRN
  Filled 2015-01-20: qty 1

## 2015-01-20 MED ORDER — ACETAMINOPHEN 325 MG PO TABS
650.0000 mg | ORAL_TABLET | ORAL | Status: DC | PRN
Start: 2015-01-20 — End: 2015-01-22
  Administered 2015-01-21: 650 mg via ORAL
  Filled 2015-01-20: qty 2

## 2015-01-20 MED ORDER — DIBUCAINE 1 % RE OINT: 1.0000 "application " | TOPICAL_OINTMENT | RECTAL | Status: DC | PRN

## 2015-01-20 MED ORDER — LIDOCAINE HCL (PF) 1 % IJ SOLN
INTRAMUSCULAR | Status: DC | PRN
  Administered 2015-01-20: 3 mL via EPIDURAL

## 2015-01-20 MED ORDER — DEXAMETHASONE 4 MG PO TABS
8.0000 mg | ORAL_TABLET | Freq: Every day | ORAL | Status: AC
  Administered 2015-01-21 – 2015-01-22 (×3): 8 mg via ORAL
  Filled 2015-01-20 (×3): qty 2

## 2015-01-20 MED ORDER — BENZOCAINE-MENTHOL 20-0.5 % EX AERO
1.0000 | INHALATION_SPRAY | CUTANEOUS | Status: DC | PRN
Start: 2015-01-20 — End: 2015-01-22

## 2015-01-20 MED ORDER — MISOPROSTOL 200 MCG PO TABS
1000.0000 ug | ORAL_TABLET | ORAL | Status: AC
  Administered 2015-01-20: 1000 ug via RECTAL

## 2015-01-20 MED ORDER — OXYCODONE-ACETAMINOPHEN 5-325 MG PO TABS: 1.0000 | ORAL_TABLET | ORAL | Status: DC | PRN

## 2015-01-20 MED ORDER — ACETAMINOPHEN 325 MG PO TABS: 650.0000 mg | ORAL_TABLET | ORAL | Status: DC | PRN

## 2015-01-20 MED ORDER — LANOLIN HYDROUS EX OINT: TOPICAL_OINTMENT | CUTANEOUS | Status: DC | PRN

## 2015-01-20 MED ORDER — EPHEDRINE 5 MG/ML INJ
10.0000 mg | INTRAVENOUS | Status: DC | PRN
  Filled 2015-01-20: qty 2

## 2015-01-20 MED ORDER — LIDOCAINE-EPINEPHRINE (PF) 2 %-1:200000 IJ SOLN
INTRAMUSCULAR | Status: DC | PRN
  Administered 2015-01-20: 5 mL via EPIDURAL
  Administered 2015-01-20: 3 mL via EPIDURAL

## 2015-01-20 MED ORDER — OXYTOCIN 40 UNITS IN LACTATED RINGERS INFUSION - SIMPLE MED: 1.0000 m[IU]/min | INTRAVENOUS | Status: DC

## 2015-01-20 NOTE — Anesthesia Procedure Notes (Signed)
Epidural Patient location during procedure: OB  Staffing Anesthesiologist: Elim Peale Performed by: anesthesiologist   Preanesthetic Checklist Completed: patient identified, site marked, surgical consent, pre-op evaluation, timeout performed, IV checked, risks and benefits discussed and monitors and equipment checked  Epidural Patient position: sitting Prep: site prepped and draped and DuraPrep Patient monitoring: continuous pulse ox and blood pressure Approach: midline Location: L3-L4 Injection technique: LOR saline  Needle:  Needle type: Tuohy  Needle gauge: 17 G Needle length: 9 cm and 9 Needle insertion depth: 4 cm Catheter type: closed end flexible Catheter size: 19 Gauge Catheter at skin depth: 8 cm Test dose: negative  Assessment Events: blood not aspirated, injection not painful, no injection resistance, negative IV test and no paresthesia  Additional Notes Patient identified. Risks/Benefits/Options discussed with patient including but not limited to bleeding, infection, nerve damage, paralysis, failed block, incomplete pain control, headache, blood pressure changes, nausea, vomiting, reactions to medications, itching and postpartum back pain. Confirmed with bedside nurse the patient's most recent platelet count. Confirmed with patient that they are not currently taking any anticoagulation, have any bleeding history or any family history of bleeding disorders. Patient expressed understanding and wished to proceed. All questions were answered. Sterile technique was used throughout the entire procedure. Please see nursing notes for vital signs. Test dose was given through epidural catheter and negative prior to continuing to dose epidural or start infusion. Warning signs of high block given to the patient including shortness of breath, tingling/numbness in hands, complete motor block, or any concerning symptoms with instructions to call for help. Patient was given  instructions on fall risk and not to get out of bed. All questions and concerns addressed with instructions to call with any issues or inadequate analgesia.

## 2015-01-20 NOTE — Progress Notes (Signed)
Pt assisted to BR for initial PP void. Pt assisted to stedy device and safely transferred to BR. Void of urine noted, pt educated on perineal hygiene and can return correct demo. Syncapol episode noted, ammonia used and juice provided. Pt reports feeling better.Pt asisted to w/c safely and prepped for routine transfer to M/B.

## 2015-01-20 NOTE — Progress Notes (Signed)
RN x2 at bedside, fundal assessment performed, and large clots noted. Pitocin 40 Units in 1L LR infusing at bolus rate, I/O cath performed and fundal massage repeated.Dr. Benjie Karvonen called and update provided. Orders to administer Cytotec 1000 mcg PR received.

## 2015-01-20 NOTE — Progress Notes (Signed)
Bedside report given and care relinquished to P. Owens Shark, RN.

## 2015-01-20 NOTE — Progress Notes (Signed)
Dr. Benjie Karvonen called and update provided RE: PP bleeding, fundal assessment, absence of clots, stable VS. No further orders required.

## 2015-01-20 NOTE — Progress Notes (Signed)
Nancy Colon is a 34 y.o. G3P2002 at [redacted]w[redacted]d. NO complaints. Mild UCs per pt.   Objective: BP 119/67 mmHg  Pulse 78  Temp(Src) 98.3 F (36.8 C) (Oral)  Resp 18  Ht 5\' 2"  (1.575 m)  Wt 140 lb (63.504 kg)  BMI 25.60 kg/m2  LMP 04/28/2014    FHT: A- 150s/ Category I, no decels.            B- 145-150/ cate I, no decels UC:   regular, every 2-5 minutes SVE:   Dilation: 5.5 Effacement (%): 80 Station: -2 Exam by:: Jatara Huettner, MD AROM, mod mec fluid. FSE on A placed. Tolerated well. Head was oblique, left corner. Baby positioned with fundal pressure and side pressure by RN, while controlled AROM done.  Rechecked after 15 minutes, head now centered, well applied to cervix. Cervix posterior still.    Assessment / Plan: Nancy Colon, now s/p AROM -A. Epidural in place with minimal dosing (TKO) and will increase dose as patient requests. Foley after that.  Anticipate SVDs.   Nancy Colon 01/20/2015, 3:02 PM

## 2015-01-20 NOTE — Progress Notes (Signed)
Pt received from L&D at 2110. Fundal check @ 2115 uterus was deviated to right, 1/U. Large clot x2 passed with massage- L&D RN Burman Foster called back into room and she assumed care.

## 2015-01-20 NOTE — H&P (Signed)
Nancy Colon is a 34 y.o. female presenting for labor IOL at 38.1 wks for Di-Di Vtx/Vtx twins.  Spontaneous twins. Prior SVDs 2 of 7.1/2 lbs girls, now Boy/Girl. Boy- with left pelvic kidney, otherwise normal anatomy for both and AGA, concordant interval growths.  Pregnancy complicated by Gest thrombocytopenia (only in this preg) from 28 wks. Heme consult 2 wks back, took Dexamethasone 20mg  PO x 4 days x 3 rounds in 2 weeks.    History OB History    Gravida Para Term Preterm AB TAB SAB Ectopic Multiple Living   3 2 2  0 0 0 0 0 0 2     Past Medical History  Diagnosis Date  . No pertinent past medical history   . Absent kidney   . Hx of varicella   . Thrombocytopenia affecting pregnancy, antepartum [facility])    Past Surgical History  Procedure Laterality Date  . Tonsillectomy    . No past surgeries     Family History: family history is not on file. Social History:  reports that she has never smoked. She has never used smokeless tobacco. She reports that she does not drink alcohol or use illicit drugs.   Prenatal Transfer Tool  Maternal Diabetes: No Genetic Screening: Normal NT/Ultrascreen negative. AFP1 normal.  Maternal Ultrasounds/Referrals: Abnormal:  Findings:   Fetal Kidney Anomalies- female twin with left pelvic kidney Fetal Ultrasounds or other Referrals:  Referred to Materal Fetal Medicine  for anatomy f/up Maternal Substance Abuse:  No Significant Maternal Medications:  None Significant Maternal Lab Results:  Lab values include: Group B Strep negative Other Comments:  None  ROS neg  Exam Physical Exam  BP 109/70 mmHg  Pulse 81  Temp(Src) 98.3 F (36.8 C) (Oral)  Resp 18  Ht 5\' 2"  (1.575 m)  Wt 140 lb (63.504 kg)  BMI 25.60 kg/m2  LMP 04/28/2014  A&O x 3, no acute distress. Pleasant HEENT neg, no thyromegaly Lungs CTA bilat CV RRR, S1S2 normal Abdo soft, non tender, non acute, Bedside sono - A - to maternal left, station high but with rotation Vtx is at inlet. B-  maternal right and Vtx but higher.  Extr no edema/ tenderness Pelvic 2/70%/-4/ VTX FHT  A- 140-145/ + accels no decels, mod variab- Category I.          B- 140-145/ + accels/ no decels/ mod variability, category I Toco Low dose pitocin since admission, irreg to every 5 min  Prenatal labs: ABO, Rh: O/Positive/-- (03/29 0000) Antibody: Negative (03/29 0000) Rubella: Immune (03/29 0000) RPR: Nonreactive (03/29 0000)  HBsAg: Negative (03/29 0000)  HIV: Non-reactive (03/29 0000)  GBS: Negative (09/26 0000)   Assessment/Plan: 34 yo, G3P2002, at 38.1 wks, DiDi twins with concordant growth, both Cephalic with slightly oblique but corrected to present Vtx at inlet with mother on right lateral position. EFW 6-6.1/2 lbs each.  Anticipate SVD, monitor engagement of Vtx, Plan sono after 1st delivery, anticipate SVD of B since favoring cephalic now. Possible urgent C/section for any problems reviewed.  Gest.Thrombocytopenia, Platelets better at 122K with 3 courses of Dexamethasone (20mg  for total 13 days). Plan rescue Solumedrol this evening.  NICU team at birth as needed.   Star Resler R 01/20/2015, 8:29 AM

## 2015-01-20 NOTE — Progress Notes (Signed)
Nancy Colon is a 34 y.o. G3P2002 at [redacted]w[redacted]d, well dated with 1st trim sono. IOL for DiDi twins. On low dose pitocin.  No complaints.   Objective: BP 109/70 mmHg  Pulse 81  Temp(Src) 98.3 F (36.8 C) (Oral)  Resp 18  Ht 5\' 2"  (1.575 m)  Wt 140 lb (63.504 kg)  BMI 25.60 kg/m2  LMP 04/28/2014    FHT:  140-145 for both and category I UC:   regular, every 3-5 minutes SVE:   2-3/80%/ -4 VTX, not engaged Foley bulb placed in cervix to aid labor IOL. Tolerated well.   Labs: Lab Results  Component Value Date   WBC 11.2* 01/20/2015   HGB 12.9 01/20/2015   HCT 37.4 01/20/2015   MCV 97.7 01/20/2015   PLT 122* 01/20/2015    Assessment / Plan: Induction of labor due to multifetal gestation,  progressing well on pitocin, early labor.   Fetal Wellbeing:  Category I for both twins.  Epidural placement early since platelets nl now, Dr Jillyn Hidden informed and agree. Patient agrees.  AROM more dilated and engaged. Continue to monitor for that.  At present anticipate delivery Vt/Vt in Labor room.   Sereniti Wan R 01/20/2015

## 2015-01-20 NOTE — Anesthesia Preprocedure Evaluation (Signed)
Anesthesia Evaluation  Patient identified by MRN, date of birth, ID band Patient awake    Reviewed: Allergy & Precautions, H&P , NPO status , Patient's Chart, lab work & pertinent test results  Airway Mallampati: II  TM Distance: >3 FB Neck ROM: full    Dental no notable dental hx.    Pulmonary neg pulmonary ROS,    Pulmonary exam normal breath sounds clear to auscultation       Cardiovascular negative cardio ROS Normal cardiovascular exam Rhythm:regular Rate:Normal     Neuro/Psych negative neurological ROS  negative psych ROS   GI/Hepatic negative GI ROS, Neg liver ROS,   Endo/Other  negative endocrine ROS  Renal/GU negative Renal ROS  negative genitourinary   Musculoskeletal   Abdominal   Peds  Hematology  (+) Blood dyscrasia, , Gestational thrombocytopenia   Anesthesia Other Findings Pregnancy - twin gestation, complicated by gestational thrombocytopenia per hematology notes, platelets in this hospital are 122,000 which is very reassuring, they were 70,000 only a few weeks ago, she is s/p dexamethasone treatment  Allergies reviewed Denies active cardiac or pulmonary symptoms, METS > 4  Denies blood thinning medications, known bleeding disorders, hypertension, asthma, supine hypotension syndrome, previous anesthesia difficulties    Reproductive/Obstetrics (+) Pregnancy                             Anesthesia Physical Anesthesia Plan  ASA: III  Anesthesia Plan: Epidural   Post-op Pain Management:    Induction:   Airway Management Planned:   Additional Equipment:   Intra-op Plan:   Post-operative Plan:   Informed Consent: I have reviewed the patients History and Physical, chart, labs and discussed the procedure including the risks, benefits and alternatives for the proposed anesthesia with the patient or authorized representative who has indicated his/her understanding and  acceptance.     Plan Discussed with:   Anesthesia Plan Comments: (Will proceed with early epidural placement and run epidural at 1-2cc/hr until she has pain and then we will bolus up epidural and run at appropriate dose)        Anesthesia Quick Evaluation

## 2015-01-21 DIAGNOSIS — D62 Acute posthemorrhagic anemia: Secondary | ICD-10-CM | POA: Diagnosis not present

## 2015-01-21 DIAGNOSIS — O99119 Other diseases of the blood and blood-forming organs and certain disorders involving the immune mechanism complicating pregnancy, unspecified trimester: Secondary | ICD-10-CM | POA: Diagnosis present

## 2015-01-21 DIAGNOSIS — D696 Thrombocytopenia, unspecified: Secondary | ICD-10-CM | POA: Diagnosis present

## 2015-01-21 LAB — CBC
HEMATOCRIT: 26 % — AB (ref 36.0–46.0)
HEMOGLOBIN: 9.2 g/dL — AB (ref 12.0–15.0)
MCH: 34.2 pg — ABNORMAL HIGH (ref 26.0–34.0)
MCHC: 35.4 g/dL (ref 30.0–36.0)
MCV: 96.7 fL (ref 78.0–100.0)
Platelets: 109 10*3/uL — ABNORMAL LOW (ref 150–400)
RBC: 2.69 MIL/uL — ABNORMAL LOW (ref 3.87–5.11)
RDW: 13.6 % (ref 11.5–15.5)
WBC: 17 10*3/uL — ABNORMAL HIGH (ref 4.0–10.5)

## 2015-01-21 MED ORDER — POLYSACCHARIDE IRON COMPLEX 150 MG PO CAPS
150.0000 mg | ORAL_CAPSULE | Freq: Every day | ORAL | Status: DC
  Administered 2015-01-21 – 2015-01-22 (×2): 150 mg via ORAL
  Filled 2015-01-21 (×2): qty 1

## 2015-01-21 MED ORDER — MAGNESIUM OXIDE 400 (241.3 MG) MG PO TABS
400.0000 mg | ORAL_TABLET | Freq: Every day | ORAL | Status: DC
  Administered 2015-01-21 – 2015-01-22 (×2): 400 mg via ORAL
  Filled 2015-01-21 (×2): qty 1

## 2015-01-21 MED ORDER — IBUPROFEN 800 MG PO TABS
800.0000 mg | ORAL_TABLET | Freq: Three times a day (TID) | ORAL | Status: DC
Start: 2015-01-21 — End: 2015-01-22
  Administered 2015-01-21 – 2015-01-22 (×3): 800 mg via ORAL
  Filled 2015-01-21 (×3): qty 1

## 2015-01-21 NOTE — Lactation Note (Signed)
This note was copied from the chart of [place]. Lactation Consultation Note Experienced BF mom BF her 1st child 6 1/2 months who is 33 yrs old, and her 2nd child for 1 1/2 yr who is 67 yrs old. Has a DEBP at home. Didn't think she needed to set up a pump at the hospital d/t baby's are BF great on the breast. Mom demonstrated hand expression w/easy flow of colostrum. Concern d/t mom had a PPH w/vaginal delivery of the twins. Has fainted 2 times, dizzy upon standing. Mom has good everted nipples for latching. Discussed pumping and mom feels like as longs as baby's are BF well she will wait. Encouraged to massage breast during BF to give more during feedings. Mom encouraged to feed baby 8-12 times/24 hours and with feeding cues. Mom is BF one at a time. Referred to Baby and Me Book in Breastfeeding section Pg. 22-23 for position options and Proper latch demonstration. Discussed positioning options, football hold will allow her to BF both at the same time if she has assistance for now. Educated about newborn behavior, importance of I&O, cluster feeding, supply and demand. Encouraged to call for assistance if needed and to verify proper latch. Encouraged comfort during BF so colostrum flows better and mom will enjoy the feeding longer. Taking deep breaths and breast massage during BF. Mom encouraged to do skin-to-skin.[place] brochure given w/resources, support groups and [place] services.  Patient Name: Nancy Colon [id number]'[id number] Date: 01/21/2015 Reason for consult: Initial assessment   Maternal Data Has patient been taught Hand Expression?: Yes Does the patient have breastfeeding experience prior to this delivery?: Yes  Feeding    LATCH Score/Interventions Intervention(s): Breast massage;Breast compression     Type of Nipple: Everted at rest and after stimulation  Comfort (Breast/Nipple): Soft / non-tender     Intervention(s): Skin to skin;Position options;Support Pillows;Breastfeeding basics  reviewed     Lactation Tools Discussed/Used     Consult Status Consult Status: Follow-up Date: 01/21/15 Follow-up type: In-patient    Nancy Colon, Elta [place] 01/21/2015, 3:12 AM

## 2015-01-21 NOTE — Progress Notes (Signed)
PPD 1 SVD - TWINS  S:  Reports feeling really tired             Tolerating po/ No nausea or vomiting             Bleeding is light             Pain controlled with motrin mostly             Up ad lib / ambulatory / voiding QS  Newborn twins ( female and female)  breast feeding  / Circumcision planned  O:               VS: BP 106/62 mmHg  Pulse 77  Temp(Src) 98.8 F (37.1 C) (Oral)  Resp 18  Ht 5\' 2"  (1.575 m)  Wt 63.504 kg (140 lb)  BMI 25.60 kg/m2  LMP 04/28/2014  Breastfeeding? Unknown   LABS:              Recent Labs  01/20/15 2010 01/21/15 0538  WBC 12.2* 17.0*  HGB 11.9* 9.2*  PLT 125* 109*               Blood type: --/--/O POS (10/16 0825)  Rubella: Immune (03/29 0000)                     I&O: Intake/Output      10/16 0701 - 10/17 0700 10/17 0701 - 10/18 0700   Urine (mL/kg/hr) 1750    Emesis/NG output 0    Blood 300    Total Output 2050     Net -2050          Emesis Occurrence 1 x                  Physical Exam:             Alert and oriented X3  Abdomen: soft, non-tender, non-distended              Fundus: firm, non-tender, U-1  Perineum: mild edema / intact  Lochia: light  Extremities: no edema, no calf pain or tenderness    A: PPD # 1 SVD twins / no repair  Doing well - stable status  P: Routine post partum orders  Lactation support / twin care guidance reviewed             Anticipate DC tomorrow.  Artelia Laroche CNM, MSN, [place] 01/21/2015, 9:40 AM

## 2015-01-21 NOTE — Lactation Note (Signed)
This note was copied from the chart of [place]. Lactation Consultation Note See Lactation note baby boy [place].  Patient Name: Nancy Colon VSYVG'C Date: 01/21/2015 Reason for consult: Initial assessment   Maternal Data Has patient been taught Hand Expression?: Yes Does the patient have breastfeeding experience prior to this delivery?: Yes  Feeding Feeding Type: Breast Fed Length of feed: 4 min  LATCH Score/Interventions       Type of Nipple: Everted at rest and after stimulation  Comfort (Breast/Nipple): Soft / non-tender     Intervention(s): Skin to skin;Position options;Support Pillows;Breastfeeding basics reviewed     Lactation Tools Discussed/Used     Consult Status Consult Status: Follow-up Date: 01/21/15 Follow-up type: In-patient    Shruti Arrey, Elta [place] 01/21/2015, 3:31 AM

## 2015-01-21 NOTE — Progress Notes (Signed)
Pt to bathroom on stedy @ 0047- able to void large amt. Started to feel dizzy and faint on way back to bed on stedy- required whiff of ammonia inhalant. Back to bed and pt stated feeling better. Pt instructed again not to get up without staff assistance and pt verbalized understanding.

## 2015-01-21 NOTE — Lactation Note (Signed)
This note was copied from the chart of [place]. Lactation Consultation Note  Patient Name: Nancy Colon [id number] Date: 01/21/2015 Reason for consult: Follow-up assessment BF mom of twins worried about supply. She states the babies have been eating all the time. She can manually express bilaterally. Babies voids WNL. Went over newborn behavior, feeding frequency, belly size, breast changes, and nipple care. Denies and pain or soreness at this time. Offered her a DEBP and she declined. She plans to keep latching babies. She is aware of O/P services and support group. She will page for help as needed.   Maternal Data    Feeding Feeding Type: Breast Fed Length of feed: 25 min  LATCH Score/Interventions                      Lactation Tools Discussed/Used     Consult Status Consult Status: Follow-up Date: 01/22/15 Follow-up type: In-patient    Denzil Hughes 01/21/2015, 9:18 PM

## 2015-01-21 NOTE — Progress Notes (Signed)
Pt did not feel ready to get up to bathroom so up to bedside commode- tolerated well without c/o dizziness.

## 2015-01-21 NOTE — Anesthesia Postprocedure Evaluation (Signed)
  Anesthesia Post-op Note  Patient: Nancy Colon  Procedure(s) Performed: * No procedures listed *  Patient Location: Mother/Baby  Anesthesia Type:Epidural  Level of Consciousness: awake, alert  and oriented  Airway and Oxygen Therapy: Patient Spontanous Breathing  Post-op Pain: none  Post-op Assessment: Post-op Vital signs reviewed, Patient's Cardiovascular Status Stable, Respiratory Function Stable, Patent Airway, No signs of Nausea or vomiting, Adequate PO intake, Pain level controlled and No headache              Post-op Vital Signs: Reviewed and stable  Last Vitals:  Filed Vitals:   01/21/15 0610  BP: 106/62  Pulse: 77  Temp: 37.1 C  Resp: 18    Complications: No apparent anesthesia complications

## 2015-01-22 MED ORDER — MAGNESIUM OXIDE 400 (241.3 MG) MG PO TABS: 400.0000 mg | ORAL_TABLET | Freq: Every day | ORAL | Status: AC

## 2015-01-22 MED ORDER — IBUPROFEN 800 MG PO TABS: 800.0000 mg | ORAL_TABLET | Freq: Three times a day (TID) | ORAL | Status: DC

## 2015-01-22 MED ORDER — POLYSACCHARIDE IRON COMPLEX 150 MG PO CAPS: 150.0000 mg | ORAL_CAPSULE | Freq: Every day | ORAL | Status: AC

## 2015-01-22 NOTE — Lactation Note (Signed)
This note was copied from the chart of [place]. Lactation Consultation Note Experienced BF mom currently BF baby in cradle position w/wide flange. Good rhythmic sucks, swallows heard. Encouraged breast massage occasionally during BF. Encouraged to BF, pump and give BM as supplementation before formula. Stressed importance of I&O and relaxation during BF and positioning. Encouraged alternating positions with baby's, breast massage, engorgement prevention, supply and demand. Encouraged and reviewed OP services. Has personal DEBP at home. Patient Name: Nancy Colon KCLEX'N Date: 01/22/2015 Reason for consult: Follow-up assessment   Maternal Data    Feeding Feeding Type: Breast Fed Length of feed: 10 min (still BF)  LATCH Score/Interventions Latch: Grasps breast easily, tongue down, lips flanged, rhythmical sucking.  Audible Swallowing: Spontaneous and intermittent Intervention(s): Hand expression Intervention(s): Alternate breast massage  Type of Nipple: Everted at rest and after stimulation  Comfort (Breast/Nipple): Soft / non-tender     Hold (Positioning): No assistance needed to correctly position infant at breast. Intervention(s): Position options;Skin to skin;Support Pillows;Breastfeeding basics reviewed  LATCH Score: 10  Lactation Tools Discussed/Used Tools: Comfort gels   Consult Status Consult Status: Complete Date: 01/22/15    Theodoro Kalata 01/22/2015, 9:36 AM

## 2015-01-22 NOTE — Discharge Summary (Signed)
OB Discharge Summary     Patient Name: Nancy Colon DOB: 1981-01-30 MRN: [id number]  Date of admission: 01/20/2015 Delivering MD:    Kisha, Messman [[id number]]  MODY, Yulitza, Shorts [[id number]]  MODY, VAISHALI   Date of discharge: 01/22/2015  Admitting diagnosis: Induction / Di-Di Twins (Vtx/Vtx) Intrauterine pregnancy: [redacted]w[redacted]d     Secondary diagnosis: None     Discharge diagnosis: Term Pregnancy Delivered                                                                                                Post partum procedures:none  Augmentation: AROM and Pitocin  Complications: None  Hospital course:  Induction of Labor With Vaginal Delivery   34 y.o. yo [id number] at [redacted]w[redacted]d was admitted to the hospital 01/20/2015 for induction of labor.  Indication for induction: Favorable cervix at term and Di-Di Twins (Vtx/Vtx).  Patient had an uncomplicated labor course as follows: Membrane Rupture Time/Date:    Makhia, Vosler [[id number]]  2:36 PM   Kurstin, Dimarzo Fraser Din [[id number]]  2:36 PM ,   Latrisa, Hellums [[id number]]  01/20/2015   Wyona, Neils [[id number]]  01/20/2015   Intrapartum Procedures: Episiotomy:    Tahani, Potier [[id number]]  None [1]   Anisa, Leanos [[id number]]  None [1]                                         Lacerations:     Fleda, Pagel [[id number]]  None [1]   Jaylina, Ramdass [[id number]]  None [1]  Patient had delivery of a Viable infant.  Information for the patient's newborn:  Ylonda, Storr [[id number]]  Delivery Method: Vag-Spont Information for the patient's newborn:  Brody, Kump [[id number]]  Delivery Method: Jenisha, Faison [[id number]]  01/20/2015   Faye, Strohman [[id number]]  01/20/2015  Details of delivery can be found in separate delivery note.  Patient had a routine postpartum course. Patient is discharged home 01/22/2015 12:50 PM    Physical exam   Filed Vitals:   01/21/15 0311 01/21/15 0610 01/21/15 1803 01/22/15 0605  BP: 110/73 106/62 109/76 109/67  Pulse:  77 92 68  Temp: 99.1 F (37.3 C) 98.8 F (37.1 C) 98.3 F (36.8 C) 97.7 F (36.5 C)  TempSrc: Oral Oral Oral Oral  Resp: 20 18 19 18   Height:      Weight:       General: alert, cooperative and no distress Lochia: appropriate Uterine Fundus: firm Perineum: Intact, no edema DVT Evaluation: No evidence of DVT seen on physical exam. Negative Homan's sign. No cords or calf tenderness. No significant calf/ankle edema. Labs: Lab Results  Component Value Date   WBC 17.0* 01/21/2015   HGB 9.2* 01/21/2015   HCT 26.0* 01/21/2015   MCV 96.7 01/21/2015   PLT 109* 01/21/2015   CMP Latest Ref Rng 12/25/2014  Glucose 70 - 140 mg/dl 79  BUN 7.0 - 26.0 mg/dL 9.3  Creatinine 0.6 - 1.1 mg/dL 0.7  Sodium 136 - 145 mEq/L 140  Potassium 3.5 - 5.1 mEq/L 3.9  CO2 22 - 29 mEq/L 25  Calcium 8.4 - 10.4 mg/dL 8.6  Total Protein 6.4 - 8.3 g/dL 5.8(L)  Total Bilirubin 0.20 - 1.20 mg/dL 0.22  Alkaline Phos 40 - 150 U/L 142  AST 5 - 34 U/L 20  ALT 0 - 55 U/L 10    Discharge instruction: per After Visit Summary and "Baby and Me Booklet".  Medications:  Current facility-administered medications:  .  acetaminophen (TYLENOL) tablet 650 mg, 650 mg, Oral, Q4H PRN, Azucena Fallen, MD, 650 mg at 01/21/15 0102 .  benzocaine-Menthol (DERMOPLAST) 20-0.5 % topical spray 1 application, 1 application, Topical, PRN, Azucena Fallen, MD .  witch hazel-glycerin (TUCKS) pad 1 application, 1 application, Topical, PRN **AND** dibucaine (NUPERCAINAL) 1 % rectal ointment 1 application, 1 application, Rectal, PRN, Azucena Fallen, MD .  ibuprofen (ADVIL,MOTRIN) tablet 800 mg, 800 mg, Oral, Q8H, Artelia Laroche, CNM, 800 mg at 01/22/15 0047 .  iron polysaccharides (NIFEREX) capsule 150 mg, 150 mg, Oral, Daily, Artelia Laroche, CNM, 150 mg at 01/22/15 1009 .  lanolin ointment, , Topical, PRN, Azucena Fallen, MD .   magnesium oxide (MAG-OX) tablet 400 mg, 400 mg, Oral, Daily, Artelia Laroche, CNM, 400 mg at 01/22/15 1009 .  oxyCODONE-acetaminophen (PERCOCET/ROXICET) 5-325 MG per tablet 1 tablet, 1 tablet, Oral, Q4H PRN, Azucena Fallen, MD .  prenatal multivitamin tablet 1 tablet, 1 tablet, Oral, Q1200, Azucena Fallen, MD, 1 tablet at 01/21/15 1221 .  simethicone (MYLICON) chewable tablet 80 mg, 80 mg, Oral, PRN, Azucena Fallen, MD  Diet: routine diet  Activity: Advance as tolerated. Pelvic rest for 6 weeks.   Outpatient follow up:6 weeks  Postpartum contraception: Undecided  Newborn Data:   Hermila, Millis [[id number]]  Live born female on 01/20/2015 Birth Weight: 6 lb 8.4 oz (2960 g) APGAR: 9, [address]   Ambre, Kobayashi [[id number]]  Live born female on 01/20/2015 Birth Weight: 5 lb 15.2 oz (2699 g) APGAR: 8, 9  Baby Feeding: Bottle Disposition:home with mother   01/22/2015 Laury Deep, Jerilynn Mages, CNM

## 2015-01-22 NOTE — Lactation Note (Signed)
This note was copied from the chart of [place]. Lactation Consultation Note Being d/c home today. Currently getting circumcised. Has been BF well w/adequate output. Encouraged mom to give breast milk as supplement first then formula. Will be giving in bottle not syring. Experienced BF of 2 other children. Gave more feeding sheets to keep up with I&O. Discussed supply and demand, positioning, comfort, nipple and breast care, engorgement prevention. Encouraged to comfort for support group for weight check if needed or OUT pt. Services for concerns. Comfort gels given. Mom has personal pump at home. Patient Name: Nancy Colon [id number]'M Date: 01/22/2015 Reason for consult: Follow-up assessment   Maternal Data    Feeding Feeding Type: Formula Length of feed: 10 min  LATCH Score/Interventions       Type of Nipple: Everted at rest and after stimulation  Comfort (Breast/Nipple): Soft / non-tender     Hold (Positioning): No assistance needed to correctly position infant at breast. Intervention(s): Support Pillows;Position options;Skin to skin     Lactation Tools Discussed/Used     Consult Status Consult Status: Complete Date: 01/22/15    Theodoro Kalata 01/22/2015, 9:30 AM

## 2015-01-22 NOTE — Discharge Instructions (Signed)
Breast Pumping Tips °If you are breastfeeding, there may be times when you cannot feed your baby directly. Returning to work or going on a trip are common examples. Pumping allows you to store breast milk and feed it to your baby later.  °You may not get much milk when you first start to pump. Your breasts should start to make more after a few days. If you pump at the times you usually feed your baby, you may be able to keep making enough milk to feed your baby without also using formula. The more often you pump, the more milk you will produce.  °WHEN SHOULD I PUMP?  °· You can begin to pump soon after delivery. However, some experts recommend waiting about 4 weeks before giving your infant a bottle to make sure breastfeeding is going well.  °· If you plan to return to work, begin pumping a few weeks before. This will help you develop techniques that work best for you. It also lets you build up a supply of breast milk.   °· When you are with your infant, feed on demand and pump after each feeding.   °· When you are away from your infant for several hours, pump for about 15 minutes every 2-3 hours. Pump both breasts at the same time if you can.   °· If your infant has a formula feeding, make sure to pump around the same time.     °· If you drink any alcohol, wait 2 hours before pumping.   °HOW DO I PREPARE TO PUMP? °Your let-down reflex is the natural reaction to stimulation that makes your breast milk flow. It is easier to stimulate this reflex when you are relaxed. Find relaxation techniques that work for you. If you have difficulty with your let-down reflex, try these methods:  °· Smell one of your infant's blankets or an item of clothing.   °· Look at a picture or video of your infant.   °· Sit in a quiet, private space.   °· Massage the breast you plan to pump.   °· Place soothing warmth on the breast.   °· Play relaxing music.   °WHAT ARE SOME GENERAL BREAST PUMPING TIPS? °· Wash your hands before you pump. You  do not need to wash your nipples or breasts. °· There are three ways to pump. °¨ You can use your hand to massage and compress your breast. °¨ You can use a handheld manual pump. °¨ You can use an electric pump.   °· Make sure the suction cup (flange) on the breast pump is the right size. Place the flange directly over the nipple. If it is the wrong size or placed the wrong way, it may be painful and cause nipple damage.   °· If pumping is uncomfortable, apply a small amount of purified or modified lanolin to your nipple and areola. °· If you are using an electric pump, adjust the speed and suction power to be more comfortable. °· If pumping is painful or if you are not getting very much milk, you may need a different type of pump. A lactation consultant can help you determine what type of pump to use.   °· Keep a full water bottle near you at all times. Drinking lots of fluid helps you make more milk.  °· You can store your milk to use later. Pumped breast milk can be stored in a sealable, sterile container or plastic bag. Label all stored breast milk with the date you pumped it. °¨ Milk can stay out at room temperature for up to 8 hours. °¨   You can store your milk in the refrigerator for up to 8 days. °¨ You can store your milk in the freezer for 3 months. Thaw frozen milk using warm water. Do not put it in the microwave. °· Do not smoke. Smoking can lower your milk supply and harm your infant. If you need help quitting, ask your health care provider to recommend a program.   °WHEN SHOULD I CALL MY HEALTH CARE PROVIDER OR A LACTATION CONSULTANT? °· You are having trouble pumping. °· You are concerned that you are not making enough milk. °· You have nipple pain, soreness, or redness. °· You want to use birth control. Birth control pills may lower your milk supply. Talk to your health care provider about your options. °  °This information is not intended to replace advice given to you by your health care provider.  Make sure you discuss any questions you have with your health care provider. °  °Document Released: 09/10/2009 Document Revised: 03/28/2013 Document Reviewed: 01/13/2013 °[employer]vier Interactive Patient Education ©2016 [employer] °Postpartum Depression and Baby Blues °The postpartum period begins right after the birth of a baby. During this time, there is often a great amount of joy and excitement. It is also a time of many changes in the life of the parents. Regardless of how many times a mother gives birth, each child brings new challenges and dynamics to the family. It is not unusual to have feelings of excitement along with confusing shifts in moods, emotions, and thoughts. All mothers are at risk of developing postpartum depression or the "baby blues." These mood changes can occur right after giving birth, or they may occur many months after giving birth. The baby blues or postpartum depression can be mild or severe. Additionally, postpartum depression can go away rather quickly, or it can be a long-term condition.  °CAUSES °Raised hormone levels and the rapid drop in those levels are thought to be a main cause of postpartum depression and the baby blues. A number of hormones change during and after pregnancy. Estrogen and progesterone usually decrease right after the delivery of your baby. The levels of thyroid hormone and various cortisol steroids also rapidly drop. Other factors that play a role in these mood changes include major life events and genetics.  °RISK FACTORS °If you have any of the following risks for the baby blues or postpartum depression, know what symptoms to watch out for during the postpartum period. Risk factors that may increase the likelihood of getting the baby blues or postpartum depression include: °· Having a personal or family history of depression.   °· Having depression while being pregnant.   °· Having premenstrual mood issues or mood issues related to oral  contraceptives. °· Having a lot of life stress.   °· Having marital conflict.   °· Lacking a social support network.   °· Having a baby with special needs.   °· Having health problems, such as diabetes.   °SIGNS AND SYMPTOMS °Symptoms of baby blues include: °· Brief changes in mood, such as going from extreme happiness to sadness. °· Decreased concentration.   °· Difficulty sleeping.   °· Crying spells, tearfulness.   °· Irritability.   °· Anxiety.   °Symptoms of postpartum depression typically begin within the first month after giving birth. These symptoms include: °· Difficulty sleeping or excessive sleepiness.   °· Marked weight loss.   °· Agitation.   °· Feelings of worthlessness.   °· Lack of interest in activity or food.   °Postpartum psychosis is a very serious condition and can be dangerous. Fortunately, it is   rare. Displaying any of the following symptoms is cause for immediate medical attention. Symptoms of postpartum psychosis include:  °· Hallucinations and delusions.   °· Bizarre or disorganized behavior.   °· Confusion or disorientation.   °DIAGNOSIS  °A diagnosis is made by an evaluation of your symptoms. There are no medical or lab tests that lead to a diagnosis, but there are various questionnaires that a health care provider may use to identify those with the baby blues, postpartum depression, or psychosis. Often, a screening tool called the Edinburgh Postnatal Depression Scale is used to diagnose depression in the postpartum period.  °TREATMENT °The baby blues usually goes away on its own in 1-2 weeks. Social support is often all that is needed. You will be encouraged to get adequate sleep and rest. Occasionally, you may be given medicines to help you sleep.  °Postpartum depression requires treatment because it can last several months or longer if it is not treated. Treatment may include individual or group therapy, medicine, or both to address any social, physiological, and psychological factors  that may play a role in the depression. Regular exercise, a healthy diet, rest, and social support may also be strongly recommended.  °Postpartum psychosis is more serious and needs treatment right away. Hospitalization is often needed. °HOME CARE INSTRUCTIONS °· Get as much rest as you can. Nap when the baby sleeps.   °· Exercise regularly. Some women find yoga and walking to be beneficial.   °· Eat a balanced and nourishing diet.   °· Do little things that you enjoy. Have a cup of tea, take a bubble bath, read your favorite magazine, or listen to your favorite music. °· Avoid alcohol.   °· Ask for help with household chores, cooking, grocery shopping, or running errands as needed. Do not try to do everything.   °· Talk to people close to you about how you are feeling. Get support from your partner, family members, friends, or other new moms. °· Try to stay positive in how you think. Think about the things you are grateful for.   °· Do not spend a lot of time alone.   °· Only take over-the-counter or prescription medicine as directed by your health care provider. °· Keep all your postpartum appointments.   °· Let your health care provider know if you have any concerns.   °SEEK MEDICAL CARE IF: °You are having a reaction to or problems with your medicine. °SEEK IMMEDIATE MEDICAL CARE IF: °· You have suicidal feelings.   °· You think you may harm the baby or someone else. °MAKE SURE YOU: °· Understand these instructions. °· Will watch your condition. °· Will get help right away if you are not doing well or get worse. °  °This information is not intended to replace advice given to you by your health care provider. Make sure you discuss any questions you have with your health care provider. °  °Document Released: 12/26/2003 Document Revised: 03/28/2013 Document Reviewed: 01/02/2013 °Elsevier Interactive Patient Education ©2016 [employer] °Postpartum Care After Vaginal Delivery °After you deliver your newborn  (postpartum period), the usual stay in the hospital is 24-72 hours. If there were problems with your labor or delivery, or if you have other medical problems, you might be in the hospital longer.  °While you are in the hospital, you will receive help and instructions on how to care for yourself and your newborn during the postpartum period.  °While you are in the hospital: °· Be sure to tell your nurses if you have pain or discomfort, as well as   where you feel the pain and what makes the pain worse. °· If you had an incision made near your vagina (episiotomy) or if you had some tearing during delivery, the nurses may put ice packs on your episiotomy or tear. The ice packs may help to reduce the pain and swelling. °· If you are breastfeeding, you may feel uncomfortable contractions of your uterus for a couple of weeks. This is normal. The contractions help your uterus get back to normal size. °· It is normal to have some bleeding after delivery. °¨ For the first 1-3 days after delivery, the flow is red and the amount may be similar to a period. °¨ It is common for the flow to start and stop. °¨ In the first few days, you may pass some small clots. Let your nurses know if you begin to pass large clots or your flow increases. °¨ Do not  flush blood clots down the toilet before having the nurse look at them. °¨ During the next 3-10 days after delivery, your flow should become more watery and pink or brown-tinged in color. °¨ Ten to fourteen days after delivery, your flow should be a small amount of yellowish-white discharge. °¨ The amount of your flow will decrease over the first few weeks after delivery. Your flow may stop in 6-8 weeks. Most women have had their flow stop by 12 weeks after delivery. °· You should change your sanitary pads frequently. °· Wash your hands thoroughly with soap and water for at least 20 seconds after changing pads, using the toilet, or before holding or feeding your newborn. °· You should  feel like you need to empty your bladder within the first 6-8 hours after delivery. °· In case you become weak, lightheaded, or faint, call your nurse before you get out of bed for the first time and before you take a shower for the first time. °· Within the first few days after delivery, your breasts may begin to feel tender and full. This is called engorgement. Breast tenderness usually goes away within 48-72 hours after engorgement occurs. You may also notice milk leaking from your breasts. If you are not breastfeeding, do not stimulate your breasts. Breast stimulation can make your breasts produce more milk. °· Spending as much time as possible with your newborn is very important. During this time, you and your newborn can feel close and get to know each other. Having your newborn stay in your room (rooming in) will help to strengthen the bond with your newborn.  It will give you time to get to know your newborn and become comfortable caring for your newborn. °· Your hormones change after delivery. Sometimes the hormone changes can temporarily cause you to feel sad or tearful. These feelings should not last more than a few days. If these feelings last longer than that, you should talk to your caregiver. °· If desired, talk to your caregiver about methods of family planning or contraception. °· Talk to your caregiver about immunizations. Your caregiver may want you to have the following immunizations before leaving the hospital: °¨ Tetanus, diphtheria, and pertussis (Tdap) or tetanus and diphtheria (Td) immunization. It is very important that you and your family (including grandparents) or others caring for your newborn are up-to-date with the Tdap or Td immunizations. The Tdap or Td immunization can help protect your newborn from getting ill. °¨ Rubella immunization. °¨ Varicella (chickenpox) immunization. °¨ Influenza immunization. You should receive this annual immunization if you did not receive the    immunization during your pregnancy. °  °This information is not intended to replace advice given to you by your health care provider. Make sure you discuss any questions you have with your health care provider. °  °Document Released: 01/18/2007 Document Revised: 12/16/2011 Document Reviewed: 11/18/2011 °[employer]vier Interactive Patient Education ©2016 [employer] °Breastfeeding and Mastitis °Mastitis is inflammation of the breast tissue. It can occur in women who are breastfeeding. This can make breastfeeding painful. Mastitis will sometimes go away on its own. Your health care provider will help determine if treatment is needed. °CAUSES °Mastitis is often associated with a blocked milk (lactiferous) duct. This can happen when too much milk builds up in the breast. Causes of excess milk in the breast can include: °· Poor latch-on. If your baby is not latched onto the breast properly, she or he may not empty your breast completely while breastfeeding. °· Allowing too much time to pass between feedings. °· Wearing a bra or other clothing that is too tight. This puts extra pressure on the lactiferous ducts so milk does not flow through them as it should. °Mastitis can also be caused by a bacterial infection. Bacteria may enter the breast tissue through cuts or openings in the skin. In women who are breastfeeding, this may occur because of cracked or irritated skin. Cracks in the skin are often caused when your baby does not latch on properly to the breast. °SIGNS AND SYMPTOMS °· Swelling, redness, tenderness, and pain in an area of the breast. °· Swelling of the glands under the arm on the same side. °· Fever may or may not accompany mastitis. °If an infection is allowed to progress, a collection of pus (abscess) may develop. °DIAGNOSIS  °Your health care provider can usually diagnose mastitis based on your symptoms and a physical exam. Tests may be done to help confirm the diagnosis. These may include: °· Removal of pus  from the breast by applying pressure to the area. This pus can be examined in the lab to determine which bacteria are present. If an abscess has developed, the fluid in the abscess can be removed with a needle. This can also be used to confirm the diagnosis and determine the bacteria present. In most cases, pus will not be present. °· Blood tests to determine if your body is fighting a bacterial infection. °· Mammogram or ultrasound tests to rule out other problems or diseases. °TREATMENT  °Mastitis that occurs with breastfeeding will sometimes go away on its own. Your health care provider may choose to wait 24 hours after first seeing you to decide whether a prescription medicine is needed. If your symptoms are worse after 24 hours, your health care provider will likely prescribe an antibiotic medicine to treat the mastitis. He or she will determine which bacteria are most likely causing the infection and will then select an appropriate antibiotic medicine. This is sometimes changed based on the results of tests performed to identify the bacteria, or if there is no response to the antibiotic medicine selected. Antibiotic medicines are usually given by mouth. You may also be given medicine for pain. °HOME CARE INSTRUCTIONS °· Only take over-the-counter or prescription medicines for pain, fever, or discomfort as directed by your health care provider. °· If your health care provider prescribed an antibiotic medicine, take the medicine as directed. Make sure you finish it even if you start to feel better. °· Do not wear a tight or underwire bra. Wear a soft, supportive bra. °· Increase your fluid   intake, especially if you have a fever. °· Continue to empty the breast. Your health care provider can tell you whether this milk is safe for your infant or needs to be thrown out. You may be told to stop nursing until your health care provider thinks it is safe for your baby. Use a breast pump if you are advised to stop  nursing. °· Keep your nipples clean and dry. °· Empty the first breast completely before going to the other breast. If your baby is not emptying your breasts completely for some reason, use a breast pump to empty your breasts. °· If you go back to work, pump your breasts while at work to stay in time with your nursing schedule. °· Avoid allowing your breasts to become overly filled with milk (engorged). °SEEK MEDICAL CARE IF: °· You have pus-like discharge from the breast. °· Your symptoms do not improve with the treatment prescribed by your health care provider within 2 days. °SEEK IMMEDIATE MEDICAL CARE IF: °· Your pain and swelling are getting worse. °· You have pain that is not controlled with medicine. °· You have a red line extending from the breast toward your armpit. °· You have a fever or persistent symptoms for more than 2-3 days. °· You have a fever and your symptoms suddenly get worse. °MAKE SURE YOU:  °· Understand these instructions. °· Will watch your condition. °· Will get help right away if you are not doing well or get worse. °  °This information is not intended to replace advice given to you by your health care provider. Make sure you discuss any questions you have with your health care provider. °  °Document Released: 07/18/2004 Document Revised: 03/28/2013 Document Reviewed: 10/27/2012 °[employer]vier Interactive Patient Education ©2016 [employer] ° °Breastfeeding °Deciding to breastfeed is one of the best choices you can make for you and your baby. A change in hormones during pregnancy causes your breast tissue to grow and increases the number and size of your milk ducts. These hormones also allow proteins, sugars, and fats from your blood supply to make breast milk in your milk-producing glands. Hormones prevent breast milk from being released before your baby is born as well as prompt milk flow after birth. Once breastfeeding has begun, thoughts of your baby, as well as his or her sucking or  crying, can stimulate the release of milk from your milk-producing glands.  °BENEFITS OF BREASTFEEDING °For Your Baby °· Your first milk (colostrum) helps your baby's digestive system function better. °· There are antibodies in your milk that help your baby fight off infections. °· Your baby has a lower incidence of asthma, allergies, and sudden infant death syndrome. °· The nutrients in breast milk are better for your baby than infant formulas and are designed uniquely for your baby's needs. °· Breast milk improves your baby's brain development. °· Your baby is less likely to develop other conditions, such as childhood obesity, asthma, or type 2 diabetes mellitus. °For You °· Breastfeeding helps to create a very special bond between you and your baby. °· Breastfeeding is convenient. Breast milk is always available at the correct temperature and costs nothing. °· Breastfeeding helps to burn calories and helps you lose the weight gained during pregnancy. °· Breastfeeding makes your uterus contract to its prepregnancy size faster and slows bleeding (lochia) after you give birth.   °· Breastfeeding helps to lower your risk of developing type 2 diabetes mellitus, osteoporosis, and breast or ovarian cancer later in life. °  SIGNS THAT YOUR BABY IS HUNGRY °Early Signs of Hunger °· Increased alertness or activity. °· Stretching. °· Movement of the head from side to side. °· Movement of the head and opening of the mouth when the corner of the mouth or cheek is stroked (rooting). °· Increased sucking sounds, smacking lips, cooing, sighing, or squeaking. °· Hand-to-mouth movements. °· Increased sucking of fingers or hands. °Late Signs of Hunger °· Fussing. °· Intermittent crying. °Extreme Signs of Hunger °Signs of extreme hunger will require calming and consoling before your baby will be able to breastfeed successfully. Do not wait for the following signs of extreme hunger to occur before you initiate  breastfeeding: °· Restlessness. °· A loud, strong cry. °· Screaming. °BREASTFEEDING BASICS °Breastfeeding Initiation °· Find a comfortable place to sit or lie down, with your neck and back well supported. °· Place a pillow or rolled up blanket under your baby to bring him or her to the level of your breast (if you are seated). Nursing pillows are specially designed to help support your arms and your baby while you breastfeed. °· Make sure that your baby's abdomen is facing your abdomen. °· Gently massage your breast. With your fingertips, massage from your chest wall toward your nipple in a circular motion. This encourages milk flow. You may need to continue this action during the feeding if your milk flows slowly. °· Support your breast with 4 fingers underneath and your thumb above your nipple. Make sure your fingers are well away from your nipple and your baby's mouth. °· Stroke your baby's lips gently with your finger or nipple. °· When your baby's mouth is open wide enough, quickly bring your baby to your breast, placing your entire nipple and as much of the colored area around your nipple (areola) as possible into your baby's mouth. °¨ More areola should be visible above your baby's upper lip than below the lower lip. °¨ Your baby's tongue should be between his or her lower gum and your breast. °· Ensure that your baby's mouth is correctly positioned around your nipple (latched). Your baby's lips should create a seal on your breast and be turned out (everted). °· It is common for your baby to suck about 2-3 minutes in order to start the flow of breast milk. °Latching °Teaching your baby how to latch on to your breast properly is very important. An improper latch can cause nipple pain and decreased milk supply for you and poor weight gain in your baby. Also, if your baby is not latched onto your nipple properly, he or she may swallow some air during feeding. This can make your baby fussy. Burping your baby when  you switch breasts during the feeding can help to get rid of the air. However, teaching your baby to latch on properly is still the best way to prevent fussiness from swallowing air while breastfeeding. °Signs that your baby has successfully latched on to your nipple: °· Silent tugging or silent sucking, without causing you pain. °· Swallowing heard between every 3-4 sucks. °· Muscle movement above and in front of his or her ears while sucking. °Signs that your baby has not successfully latched on to nipple: °· Sucking sounds or smacking sounds from your baby while breastfeeding. °· Nipple pain. °If you think your baby has not latched on correctly, slip your finger into the corner of your baby's mouth to break the suction and place it between your baby's gums. Attempt breastfeeding initiation again. °Signs of Successful Breastfeeding °  Signs from your baby: °· A gradual decrease in the number of sucks or complete cessation of sucking. °· Falling asleep. °· Relaxation of his or her body. °· Retention of a small amount of milk in his or her mouth. °· Letting go of your breast by himself or herself. °Signs from you: °· Breasts that have increased in firmness, weight, and size 1-3 hours after feeding. °· Breasts that are softer immediately after breastfeeding. °· Increased milk volume, as well as a change in milk consistency and color by the fifth day of breastfeeding. °· Nipples that are not sore, cracked, or bleeding. °Signs That Your Baby is Getting Enough Milk °· Wetting at least 3 diapers in a 24-hour period. The urine should be clear and pale yellow by age 5 days. °· At least 3 stools in a 24-hour period by age 5 days. The stool should be soft and yellow. °· At least 3 stools in a 24-hour period by age 7 days. The stool should be seedy and yellow. °· No loss of weight greater than 10% of birth weight during the first 3 days of age. °· Average weight gain of 4-7 ounces (113-198 g) per week after age 4  days. °· Consistent daily weight gain by age 5 days, without weight loss after the age of 2 weeks. °After a feeding, your baby may spit up a small amount. This is common. °BREASTFEEDING FREQUENCY AND DURATION °Frequent feeding will help you make more milk and can prevent sore nipples and breast engorgement. Breastfeed when you feel the need to reduce the fullness of your breasts or when your baby shows signs of hunger. This is called "breastfeeding on demand." Avoid introducing a pacifier to your baby while you are working to establish breastfeeding (the first 4-6 weeks after your baby is born). After this time you may choose to use a pacifier. Research has shown that pacifier use during the first year of a baby's life decreases the risk of sudden infant death syndrome (SIDS). °Allow your baby to feed on each breast as long as he or she wants. Breastfeed until your baby is finished feeding. When your baby unlatches or falls asleep while feeding from the first breast, offer the second breast. Because newborns are often sleepy in the first few weeks of life, you may need to awaken your baby to get him or her to feed. °Breastfeeding times will vary from baby to baby. However, the following rules can serve as a guide to help you ensure that your baby is properly fed: °· Newborns (babies 4 weeks of age or younger) may breastfeed every 1-3 hours. °· Newborns should not go longer than 3 hours during the day or 5 hours during the night without breastfeeding. °· You should breastfeed your baby a minimum of 8 times in a 24-hour period until you begin to introduce solid foods to your baby at around 6 months of age. °BREAST MILK PUMPING °Pumping and storing breast milk allows you to ensure that your baby is exclusively fed your breast milk, even at times when you are unable to breastfeed. This is especially important if you are going back to work while you are still breastfeeding or when you are not able to be present during  feedings. Your lactation consultant can give you guidelines on how long it is safe to store breast milk. °A breast pump is a machine that allows you to pump milk from your breast into a sterile bottle. The pumped breast milk can   then be stored in a refrigerator or freezer. Some breast pumps are operated by hand, while others use electricity. Ask your lactation consultant which type will work best for you. Breast pumps can be purchased, but some hospitals and breastfeeding support groups lease breast pumps on a monthly basis. A lactation consultant can teach you how to hand express breast milk, if you prefer not to use a pump. °CARING FOR YOUR BREASTS WHILE YOU BREASTFEED °Nipples can become dry, cracked, and sore while breastfeeding. The following recommendations can help keep your breasts moisturized and healthy: °· Avoid using soap on your nipples. °· Wear a supportive bra. Although not required, special nursing bras and tank tops are designed to allow access to your breasts for breastfeeding without taking off your entire bra or top. Avoid wearing underwire-style bras or extremely tight bras. °· Air dry your nipples for 3-4 minutes after each feeding. °· Use only cotton bra pads to absorb leaked breast milk. Leaking of breast milk between feedings is normal. °· Use lanolin on your nipples after breastfeeding. Lanolin helps to maintain your skin's normal moisture barrier. If you use pure lanolin, you do not need to wash it off before feeding your baby again. Pure lanolin is not toxic to your baby. You may also hand express a few drops of breast milk and gently massage that milk into your nipples and allow the milk to air dry. °In the first few weeks after giving birth, some women experience extremely full breasts (engorgement). Engorgement can make your breasts feel heavy, warm, and tender to the touch. Engorgement peaks within 3-5 days after you give birth. The following recommendations can help ease  engorgement: °· Completely empty your breasts while breastfeeding or pumping. You may want to start by applying warm, moist heat (in the shower or with warm water-soaked hand towels) just before feeding or pumping. This increases circulation and helps the milk flow. If your baby does not completely empty your breasts while breastfeeding, pump any extra milk after he or she is finished. °· Wear a snug bra (nursing or regular) or tank top for 1-2 days to signal your body to slightly decrease milk production. °· Apply ice packs to your breasts, unless this is too uncomfortable for you. °· Make sure that your baby is latched on and positioned properly while breastfeeding. °If engorgement persists after 48 hours of following these recommendations, contact your health care provider or a lactation consultant. °OVERALL HEALTH CARE RECOMMENDATIONS WHILE BREASTFEEDING °· Eat healthy foods. Alternate between meals and snacks, eating 3 of each per day. Because what you eat affects your breast milk, some of the foods may make your baby more irritable than usual. Avoid eating these foods if you are sure that they are negatively affecting your baby. °· Drink milk, fruit juice, and water to satisfy your thirst (about 10 glasses a day). °· Rest often, relax, and continue to take your prenatal vitamins to prevent fatigue, stress, and anemia. °· Continue breast self-awareness checks. °· Avoid chewing and smoking tobacco. Chemicals from cigarettes that pass into breast milk and exposure to secondhand smoke may harm your baby. °· Avoid alcohol and drug use, including marijuana. °Some medicines that may be harmful to your baby can pass through breast milk. It is important to ask your health care provider before taking any medicine, including all over-the-counter and prescription medicine as well as vitamin and herbal supplements. °It is possible to become pregnant while breastfeeding. If birth control is desired, ask your health care    provider about options that will be safe for your baby. °SEEK MEDICAL CARE IF: °· You feel like you want to stop breastfeeding or have become frustrated with breastfeeding. °· You have painful breasts or nipples. °· Your nipples are cracked or bleeding. °· Your breasts are red, tender, or warm. °· You have a swollen area on either breast. °· You have a fever or chills. °· You have nausea or vomiting. °· You have drainage other than breast milk from your nipples. °· Your breasts do not become full before feedings by the fifth day after you give birth. °· You feel sad and depressed. °· Your baby is too sleepy to eat well. °· Your baby is having trouble sleeping.   °· Your baby is wetting less than 3 diapers in a 24-hour period. °· Your baby has less than 3 stools in a 24-hour period. °· Your baby's skin or the white part of his or her eyes becomes yellow.   °· Your baby is not gaining weight by 5 days of age. °SEEK IMMEDIATE MEDICAL CARE IF: °· Your baby is overly tired (lethargic) and does not want to wake up and feed. °· Your baby develops an unexplained fever. °  °This information is not intended to replace advice given to you by your health care provider. Make sure you discuss any questions you have with your health care provider. °  °Document Released: 03/23/2005 Document Revised: 12/12/2014 Document Reviewed: 09/14/2012 °Elsevier Interactive Patient Education ©2016 [employer] ° °

## 2015-01-22 NOTE — Progress Notes (Signed)
Patient ID: Nancy Colon, female   DOB: 1980/08/30, 34 y.o.   MRN: [id number] Post Partum Day #2            Information for the patient's newborn:  Carma, Dwiggins [[id number]]  female Information for the patient's newborn:  Chandlar, Staebell [[id number]]  female   / circumcision done on twin B Feeding: breast  Subjective: No HA, SOB, CP, F/C, breast symptoms. Pain well-controlled with Ibuprofen. Normal vaginal bleeding, no clots.      Objective:  Temp:  [97.7 F (36.5 C)-98.3 F (36.8 C)] 97.7 F (36.5 C) (10/18 0605) Pulse Rate:  [68-92] 68 (10/18 0605) Resp:  [18-19] 18 (10/18 0605) BP: (109)/(67-76) 109/67 mmHg (10/18 0605)      Recent Labs  01/20/15 2010 01/21/15 0538  WBC 12.2* 17.0*  HGB 11.9* 9.2*  HCT 35.4* 26.0*  PLT 125* 109*    Blood type: O POS (10/16 0825) Rubella: Immune (03/29 0000)    Physical Exam:  General: alert, cooperative and no distress Uterine Fundus: firm Lochia: appropriate Perineum: Intact, edema none DVT Evaluation: No evidence of DVT seen on physical exam. Negative Homan's sign. No cords or calf tenderness. No significant calf/ankle edema.    Assessment/Plan: PPD # 2 / 34 y.o., [id number] S/P: induced vaginal   Principal Problem:   Postpartum care following vaginal delivery of Di-Di twins (10/16) Active Problems:   SVD (spontaneous vaginal delivery)   Thrombocytopenia complicating pregnancy ([place])   Acute blood loss anemia   Normal postpartum exam  Continue current postpartum care  D/C home   LOS: 2 days   Laury Deep, M, MSN, CNM 01/22/2015, 10:13 AM

## 2015-02-05 ENCOUNTER — Inpatient Hospital Stay (HOSPITAL_COMMUNITY): Payer: 59 | Admitting: Anesthesiology

## 2015-02-05 ENCOUNTER — Inpatient Hospital Stay (HOSPITAL_COMMUNITY): Payer: 59

## 2015-02-05 ENCOUNTER — Encounter (HOSPITAL_COMMUNITY): Payer: Self-pay

## 2015-02-05 ENCOUNTER — Encounter (HOSPITAL_COMMUNITY)
Admission: AD | Disposition: A | Discharge status: Home / Self Care | Payer: Self-pay | Source: Ambulatory Visit | Attending: Obstetrics and Gynecology

## 2015-02-05 ENCOUNTER — Ambulatory Visit (HOSPITAL_COMMUNITY)
Admission: AD | Admit: 2015-02-05 | Discharge: 2015-02-05 | Disposition: A | Discharge status: Home / Self Care | Payer: 59 | Source: Ambulatory Visit | Attending: Obstetrics and Gynecology | Admitting: Obstetrics and Gynecology

## 2015-02-05 DIAGNOSIS — O029 Abnormal product of conception, unspecified: Secondary | ICD-10-CM

## 2015-02-05 DIAGNOSIS — O9081 Anemia of the puerperium: Secondary | ICD-10-CM | POA: Insufficient documentation

## 2015-02-05 DIAGNOSIS — D649 Anemia, unspecified: Secondary | ICD-10-CM | POA: Diagnosis not present

## 2015-02-05 HISTORY — PX: DILATION AND CURETTAGE OF UTERUS: SHX78

## 2015-02-05 LAB — CBC
HEMATOCRIT: 22.7 % — AB (ref 36.0–46.0)
HEMOGLOBIN: 7.4 g/dL — AB (ref 12.0–15.0)
MCH: 32.9 pg (ref 26.0–34.0)
MCHC: 32.6 g/dL (ref 30.0–36.0)
MCV: 100.9 fL — ABNORMAL HIGH (ref 78.0–100.0)
Platelets: 177 10*3/uL (ref 150–400)
RBC: 2.25 MIL/uL — AB (ref 3.87–5.11)
RDW: 14.4 % (ref 11.5–15.5)
WBC: 10.8 10*3/uL — ABNORMAL HIGH (ref 4.0–10.5)

## 2015-02-05 LAB — PROTIME-INR
INR: 1.11 (ref 0.00–1.49)
PROTHROMBIN TIME: 14.5 s (ref 11.6–15.2)

## 2015-02-05 LAB — FIBRINOGEN: FIBRINOGEN: 337 mg/dL (ref 204–475)

## 2015-02-05 LAB — APTT: APTT: 24 s (ref 24–37)

## 2015-02-05 LAB — PREPARE RBC (CROSSMATCH)

## 2015-02-05 SURGERY — DILATION AND CURETTAGE
Anesthesia: Monitor Anesthesia Care | Site: Vagina

## 2015-02-05 MED ORDER — DIPHENHYDRAMINE HCL 25 MG PO CAPS: 25.0000 mg | ORAL_CAPSULE | Freq: Once | ORAL | Status: DC

## 2015-02-05 MED ORDER — CEFAZOLIN SODIUM-DEXTROSE 2-3 GM-% IV SOLR
INTRAVENOUS | Status: DC | PRN
  Administered 2015-02-05: 2 g via INTRAVENOUS

## 2015-02-05 MED ORDER — SODIUM CHLORIDE 0.9 % IV SOLN
Freq: Once | INTRAVENOUS | Status: AC
  Administered 2015-02-05: 15:00:00 via INTRAVENOUS

## 2015-02-05 MED ORDER — MEPERIDINE HCL 25 MG/ML IJ SOLN: 6.2500 mg | INTRAMUSCULAR | Status: DC | PRN

## 2015-02-05 MED ORDER — METHYLERGONOVINE MALEATE 0.2 MG/ML IJ SOLN: 0.2000 mg | Freq: Once | INTRAMUSCULAR | Status: DC

## 2015-02-05 MED ORDER — FENTANYL CITRATE (PF) 100 MCG/2ML IJ SOLN
INTRAMUSCULAR | Status: DC | PRN
  Administered 2015-02-05: 100 ug via INTRAVENOUS

## 2015-02-05 MED ORDER — METHYLERGONOVINE MALEATE 0.2 MG PO TABS
0.2000 mg | ORAL_TABLET | Freq: Four times a day (QID) | ORAL | Status: DC
  Administered 2015-02-05: 0.2 mg via ORAL
  Filled 2015-02-05: qty 1

## 2015-02-05 MED ORDER — LACTATED RINGERS IV SOLN
INTRAVENOUS | Status: DC
  Administered 2015-02-05: 16:00:00 via INTRAVENOUS

## 2015-02-05 MED ORDER — FAMOTIDINE IN NACL 20-0.9 MG/50ML-% IV SOLN: 20.0000 mg | Freq: Once | INTRAVENOUS | Status: DC

## 2015-02-05 MED ORDER — ONDANSETRON HCL 4 MG/2ML IJ SOLN
INTRAMUSCULAR | Status: DC | PRN
  Administered 2015-02-05: 4 mg via INTRAVENOUS

## 2015-02-05 MED ORDER — FAMOTIDINE IN NACL 20-0.9 MG/50ML-% IV SOLN
INTRAVENOUS | Status: AC
  Filled 2015-02-05: qty 50

## 2015-02-05 MED ORDER — PROMETHAZINE HCL 25 MG/ML IJ SOLN: 6.2500 mg | INTRAMUSCULAR | Status: DC | PRN

## 2015-02-05 MED ORDER — SODIUM CHLORIDE 0.9 % IR SOLN
Status: DC | PRN
  Administered 2015-02-05: 1000 mL

## 2015-02-05 MED ORDER — PROPOFOL 10 MG/ML IV BOLUS
INTRAVENOUS | Status: DC | PRN
  Administered 2015-02-05 (×4): 20 mg via INTRAVENOUS

## 2015-02-05 MED ORDER — OXYCODONE-ACETAMINOPHEN 5-325 MG PO TABS: 2.0000 | ORAL_TABLET | Freq: Four times a day (QID) | ORAL | Status: AC | PRN

## 2015-02-05 MED ORDER — LIDOCAINE-EPINEPHRINE (PF) 1 %-1:200000 IJ SOLN
INTRAMUSCULAR | Status: DC | PRN
  Administered 2015-02-05: 30 mL

## 2015-02-05 MED ORDER — MIDAZOLAM HCL 2 MG/2ML IJ SOLN
INTRAMUSCULAR | Status: DC | PRN
  Administered 2015-02-05: 2 mg via INTRAVENOUS

## 2015-02-05 MED ORDER — ACETAMINOPHEN 325 MG PO TABS
650.0000 mg | ORAL_TABLET | Freq: Once | ORAL | Status: DC
Start: 2015-02-05 — End: 2015-02-05

## 2015-02-05 MED ORDER — METHYLERGONOVINE MALEATE 0.2 MG PO TABS: 0.2000 mg | ORAL_TABLET | Freq: Four times a day (QID) | ORAL | Status: AC

## 2015-02-05 MED ORDER — METHYLERGONOVINE MALEATE 0.2 MG/ML IJ SOLN
0.2000 mg | Freq: Once | INTRAMUSCULAR | Status: AC
  Administered 2015-02-05: 0.2 mg via INTRAMUSCULAR
  Filled 2015-02-05: qty 1

## 2015-02-05 MED ORDER — LIDOCAINE HCL (CARDIAC) 20 MG/ML IV SOLN
INTRAVENOUS | Status: DC | PRN
  Administered 2015-02-05: 100 mg via INTRAVENOUS

## 2015-02-05 MED ORDER — DEXAMETHASONE SODIUM PHOSPHATE 10 MG/ML IJ SOLN
INTRAMUSCULAR | Status: DC | PRN
  Administered 2015-02-05: 4 mg via INTRAVENOUS

## 2015-02-05 MED ORDER — FENTANYL CITRATE (PF) 100 MCG/2ML IJ SOLN: 25.0000 ug | INTRAMUSCULAR | Status: DC | PRN

## 2015-02-05 MED ORDER — LACTATED RINGERS IV SOLN
INTRAVENOUS | Status: DC | PRN
  Administered 2015-02-05: 15:00:00 via INTRAVENOUS

## 2015-02-05 SURGICAL SUPPLY — 16 items
CATH ROBINSON RED A/P 16FR (CATHETERS) ×3 IMPLANT
CLOTH BEACON ORANGE TIMEOUT ST (SAFETY) ×3 IMPLANT
DECANTER SPIKE VIAL GLASS SM (MISCELLANEOUS) ×3 IMPLANT
GLOVE BIO SURGEON STRL SZ 6.5 (GLOVE) ×2 IMPLANT
GLOVE BIO SURGEONS STRL SZ 6.5 (GLOVE) ×1
GLOVE BIOGEL PI IND STRL 6.5 (GLOVE) ×1 IMPLANT
GLOVE BIOGEL PI IND STRL 7.0 (GLOVE) ×3 IMPLANT
GLOVE BIOGEL PI INDICATOR 6.5 (GLOVE) ×2
GLOVE BIOGEL PI INDICATOR 7.0 (GLOVE) ×6
GOWN STRL REUS W/TWL LRG LVL3 (GOWN DISPOSABLE) ×6 IMPLANT
PACK VAGINAL MINOR WOMEN LF (CUSTOM PROCEDURE TRAY) ×3 IMPLANT
PAD OB MATERNITY 4.3X12.25 (PERSONAL CARE ITEMS) ×3 IMPLANT
SET BERKELEY SUCTION TUBING (SUCTIONS) ×3 IMPLANT
TOP DISP BERKELEY (MISCELLANEOUS) ×3 IMPLANT
TOWEL OR 17X24 6PK STRL BLUE (TOWEL DISPOSABLE) ×3 IMPLANT
VACURETTE 9 RIGID CVD (CANNULA) ×3 IMPLANT

## 2015-02-05 NOTE — Anesthesia Postprocedure Evaluation (Signed)
  Anesthesia Post-op Note  Patient: Terilyn Sano  Procedure(s) Performed: Procedure(s): Dilatation & Currettage with Suction, Ultrasound Guidance (N/A)  Patient Location: PACU  Anesthesia Type:MAC  Level of Consciousness: awake, alert  and oriented  Airway and Oxygen Therapy: Patient Spontanous Breathing  Post-op Pain: minimal  Post-op Assessment: Post-op Vital signs reviewed and Patient's Cardiovascular Status Stable              Post-op Vital Signs: Reviewed and stable  Last Vitals:  Filed Vitals:   02/05/15 1630  BP: 104/72  Pulse: 71  Temp:   Resp: 12    Complications: No apparent anesthesia complications

## 2015-02-05 NOTE — Op Note (Signed)
02/05/2015  3:26 PM  PATIENT:  Nancy Colon  34 y.o. female  PRE-OPERATIVE DIAGNOSIS:  Bleeding, postpartum hemorrhage, retained POCs, anemia  POST-OPERATIVE DIAGNOSIS:  Bleeding,  postpartum hemorrhage, retained POCs, anemia  PROCEDURE:  Procedure(s): Dilatation & Currettage with Suction, Ultrasound Guidance (N/A)  SURGEON:  Surgeon(s) and Role:    * Aloha Gell, MD - Primary  PHYSICIAN ASSISTANT:   ASSISTANTS: none   ANESTHESIA:   local and MAC  EBL:  Total I/O In: 1820 [I.V.:1200; Blood:620] Out: 845 [Urine:400; Blood:445]  BLOOD ADMINISTERED:2 CC PRBC  DRAINS: straight cath in OR   LOCAL MEDICATIONS USED:  MARCAINE    and Amount: 30 ml  SPECIMEN:  Source of Specimen:  POCs  DISPOSITION OF SPECIMEN:  PATHOLOGY  COUNTS:  YES  TOURNIQUET:  * No tourniquets in log *  DICTATION: .Note written in EPIC  PLAN OF CARE: Discharge to home after PACU  PATIENT DISPOSITION:  PACU - hemodynamically stable.   Delay start of Pharmacological VTE agent (>24hrs) due to surgical blood loss or risk of bleeding: yes  Antibiotic:2g Ancef  Findings: 20 weeks size uterus preprocedure, with 4 cm EMS, PPH with significant anemia,  decreased uterine size post procedure with adequate hemostasis. thin endometrial stripe at  64mm with no flow suggestive of continued  retained products of conception  Complications: None  Indications: A 34 yo multiparous pt, 10 days out from SVD of twins who presented with bleeding. U/s showed retained POCs and decision for D&C. Risks benefits were discussed with patient who agreed to proceed.   Procedure: After informed was obtained the patient was taken to the operating room where MAC anesthesia was initiated without difficulty. She was prepped and draped in the normal fashion in dorsal supine lithotomy position.  No bladder drainage was done due  to the desire for a full bladder for improved ultrasound guidance. A bimanual examination was performed to  assess the size and the position of the uterus. A sterile speculum was placed into the vagina. A single-tooth tenaculum was used to grasp the anterior lip of the cervix.    The cervix was serially dilated with Kennon Rounds dilators to a #27 under u/s guidance. A 9 French suction curet was advanced past the internal os under ultrasound guidance. Suction curettage was carried out. Several passes were made until the uterine cavity was completely empty. A sharp curet was used to gently palpate the uterine walls to ensure a gritty cry. Hemostasis was noted and the decision was made to end the procedure. Ultrasound was used to confirm a thin endometrial stripe and no blood flow. The tenaculum was removed. Good hemostasis was noted. The procedure was then terminated. Patient tolerated the procedure well sponge lap and needle count were correct x3. Patient seems recovery room in stable condition.  Wendie Diskin A. 02/20/2012 10:53 AM

## 2015-02-05 NOTE — Transfer of Care (Signed)
Immediate Anesthesia Transfer of Care Note  Patient: Nancy Colon  Procedure(s) Performed: Procedure(s): Dilatation & Currettage with Suction, Ultrasound Guidance (N/A)  Patient Location: PACU  Anesthesia Type:MAC  Level of Consciousness: sedated  Airway & Oxygen Therapy: Patient Spontanous Breathing  Post-op Assessment: Report given to RN  Post vital signs: Reviewed and stable  Last Vitals:  Filed Vitals:   02/05/15 1422  BP: 110/73  Pulse: 95  Temp: 37.5 C  Resp:     Complications: No apparent anesthesia complications

## 2015-02-05 NOTE — Discharge Instructions (Signed)

## 2015-02-05 NOTE — MAU Note (Signed)
Vicente Serene CNM at bedside

## 2015-02-05 NOTE — MAU Note (Signed)
Patient was sent over from Nancy Colon for postpartum hemmorhage s/p vaginal delivery of twins on 01/20/15 arrived via EMS.

## 2015-02-05 NOTE — Brief Op Note (Signed)
02/05/2015  3:26 PM  PATIENT:  Nancy Colon  34 y.o. female  PRE-OPERATIVE DIAGNOSIS:  Bleeding, postpartum hemorrhage, retained POCs, anemia  POST-OPERATIVE DIAGNOSIS:  Bleeding,  postpartum hemorrhage, retained POCs, anemia  PROCEDURE:  Procedure(s): Dilatation & Currettage with Suction, Ultrasound Guidance (N/A)  SURGEON:  Surgeon(s) and Role:    * Aloha Gell, MD - Primary  PHYSICIAN ASSISTANT:   ASSISTANTS: none   ANESTHESIA:   local and MAC  EBL:  Total I/O In: 1820 [I.V.:1200; Blood:620] Out: 845 [Urine:400; Blood:445]  BLOOD ADMINISTERED:2 CC PRBC  DRAINS: straight cath in OR   LOCAL MEDICATIONS USED:  MARCAINE    and Amount: 30 ml  SPECIMEN:  Source of Specimen:  POCs  DISPOSITION OF SPECIMEN:  PATHOLOGY  COUNTS:  YES  TOURNIQUET:  * No tourniquets in log *  DICTATION: .Note written in EPIC  PLAN OF CARE: Discharge to home after PACU  PATIENT DISPOSITION:  PACU - hemodynamically stable.   Delay start of Pharmacological VTE agent (>24hrs) due to surgical blood loss or risk of bleeding: yes

## 2015-02-05 NOTE — Anesthesia Preprocedure Evaluation (Addendum)
Anesthesia Evaluation  Patient identified by MRN, date of birth, ID band Patient awake    Reviewed: Allergy & Precautions, NPO status , Patient's Chart, lab work & pertinent test results  Airway Mallampati: I  TM Distance: >3 FB Neck ROM: Full    Dental  (+) Teeth Intact   Pulmonary neg pulmonary ROS,    breath sounds clear to auscultation       Cardiovascular negative cardio ROS   Rhythm:Regular Rate:Normal     Neuro/Psych negative neurological ROS  negative psych ROS   GI/Hepatic negative GI ROS, Neg liver ROS,   Endo/Other    Renal/GU negative Renal ROS  negative genitourinary   Musculoskeletal negative musculoskeletal ROS (+)   Abdominal   Peds negative pediatric ROS (+)  Hematology negative hematology ROS (+)   Anesthesia Other Findings   Reproductive/Obstetrics negative OB ROS                            Lab Results  Component Value Date   WBC 10.8* 02/05/2015   HGB 7.4* 02/05/2015   HCT 22.7* 02/05/2015   MCV 100.9* 02/05/2015   PLT 177 02/05/2015   Lab Results  Component Value Date   INR 1.11 02/05/2015   Lab Results  Component Value Date   CREATININE 0.7 12/25/2014   BUN 9.3 12/25/2014   NA 140 12/25/2014   K 3.9 12/25/2014   CO2 25 12/25/2014     Anesthesia Physical Anesthesia Plan  ASA: II and emergent  Anesthesia Plan: MAC   Post-op Pain Management:    Induction: Intravenous  Airway Management Planned: Nasal Cannula  Additional Equipment:   Intra-op Plan:   Post-operative Plan:   Informed Consent: I have reviewed the patients History and Physical, chart, labs and discussed the procedure including the risks, benefits and alternatives for the proposed anesthesia with the patient or authorized representative who has indicated his/her understanding and acceptance.   Dental advisory given  Plan Discussed with: CRNA  Anesthesia Plan Comments:          Anesthesia Quick Evaluation

## 2015-02-05 NOTE — H&P (Signed)
History     CSN: [id number]  Arrival date and time: 02/05/15 1028 First Provider Initiated Contact with Patient 02/05/15 1036    Care of pt and note taken over by Pamala Hurry, MD at 2 p 11/1  History reviewed with pt- 10 days PP from SVD twins. Has been doing well until acute onset heavy vaginal bleeding with lightheadedness at 6 am. Ate crackers at 9am, otherwise NPO since last night. Babies doing well. Mom breastfeeding. No fevers. Pt did have 1,000 cc PPH just after delivery (on transition from [facility] to [facility]). Pt also had delayed [place] after her 2nd pregnancy needing outpt D&C at 2 months. Not desiring future fertility and plan for vasectomy.     Chief Complaint  Patient presents with  . Vaginal Bleeding   HPI Comments: [id number], 2 wks post SVD of twins sent from office for delayed PPH. She reports VB has started back last night, was light then increased this am around 0600 and began passing large clots. No fever. Pain minimal. Report dizziness when enroute to office this am. Pregnancy only complicated by twin gestation and gestational thrombocytopenia. SVD x2 infants uncomplicated with intact perineum. Postpartum course uncomplicated.   Vaginal Bleeding    OB History    Gravida Para Term Preterm AB TAB SAB Ectopic Multiple Living   3 3 3  0 0 0 0 0 1 4      Past Medical History  Diagnosis Date  . No pertinent past medical history   . Absent kidney   . Hx of varicella   . Thrombocytopenia affecting pregnancy, antepartum ([place])   . Postpartum care following vaginal delivery of Di-Di twins (10/16) 01/20/2015    Past Surgical History  Procedure Laterality Date  . Tonsillectomy    . No past surgeries      No family history on file.  Social History  Substance Use Topics  . Smoking status: Never Smoker   . Smokeless tobacco: Never Used  . Alcohol Use: No    Allergies:  Allergies  Allergen Reactions  . Sulfa Antibiotics Other (See Comments)    Happened in childhood reaction  unknown    Prescriptions prior to admission  Medication Sig Dispense Refill Last Dose  . dexamethasone (DECADRON) 4 MG tablet Take 5 pills a day with food for 4 days. Start on September 21 (Patient not taking: Reported on 01/20/2015) 20 tablet 2 Completed Course at Unknown time  . ibuprofen (ADVIL,MOTRIN) 800 MG tablet Take 1 tablet (800 mg total) by mouth every 8 (eight) hours. 30 tablet 0   . iron polysaccharides (NIFEREX) 150 MG capsule Take 1 capsule (150 mg total) by mouth daily. 30 capsule 3   . magnesium oxide (MAG-OX) 400 (241.3 MG) MG tablet Take 1 tablet (400 mg total) by mouth daily. 30 tablet 3   . OVER THE COUNTER MEDICATION Take 1 tablet by mouth daily. Patient said she is taking a over the counter 65 mg iron tablet   Past Week at Unknown time  . Prenatal Vit-Fe Fumarate-FA (PRENATAL MULTIVITAMIN) TABS Take 1 tablet by mouth daily.   Past Week at Unknown time    Review of Systems  Constitutional: Negative.   HENT: Negative.   Eyes: Negative.   Respiratory: Negative.   Cardiovascular: Negative.   Gastrointestinal: Negative.   Genitourinary: Positive for vaginal bleeding.  Musculoskeletal: Negative.   Skin: Negative.   Neurological: Positive for dizziness.  Endo/Heme/Allergies: Negative.   Psychiatric/Behavioral: Negative.    Physical Exam   Blood pressure  93/43, pulse 66, temperature 98.1 F (36.7 C), temperature source Oral, resp. rate 16, SpO2 98 %, unknown if currently breastfeeding.   Filed Vitals:   02/05/15 1215 02/05/15 1230 02/05/15 1245 02/05/15 1300  BP: 96/61 95/61 91/55  98/56  Pulse: 84 80 75 81  Temp:      TempSrc:      Resp:      SpO2:        Exam by M. Bhambri Physical Exam  Constitutional: She is oriented to person, place, and time. She appears well-developed and well-nourished.  HENT:  Head: Normocephalic and atraumatic.  Neck: Normal range of motion. Neck supple.  Cardiovascular: Normal rate.   Respiratory: Effort normal.  GI: Soft. She  exhibits no distension. There is no tenderness. There is no rebound.  Genitourinary: Vagina normal.  Speculum: multiple clots evacuated from vault, no tissue seen, perineum intact, cervix visually closed EBL approx. 300 mL  Musculoskeletal: Normal range of motion.  Neurological: She is alert and oriented to person, place, and time.  Skin: Skin is warm and dry.  pale    Repeat PE, 2P Gen: well appearing, no distress Skin: warm dry, pale Abd: no uterine tenderness, very mobile, no RUQ pain, no distension GU: staining on pad, no active bleeding LE: NT, no edema  Results for LABRISHA, WUELLNER (MRN [id number]) as of 02/05/2015 12:10  Ref. Range 02/05/2015 10:50  WBC Latest Ref Range: 4.0-10.5 K/uL 10.8 (H)  RBC Latest Ref Range: 3.87-5.11 MIL/uL 2.25 (L)  Hemoglobin Latest Ref Range: 12.0-15.0 g/dL 7.4 (L)  HCT Latest Ref Range: 36.0-46.0 % 22.7 (L)  MCV Latest Ref Range: 78.0-100.0 fL 100.9 (H)  MCH Latest Ref Range: 26.0-34.0 pg 32.9  MCHC Latest Ref Range: 30.0-36.0 g/dL 32.6  RDW Latest Ref Range: 11.5-15.5 % 14.4  Platelets Latest Ref Range: 150-400 K/uL 177  Fibrinogen Latest Ref Range: 204-475 mg/dL 337  Prothrombin Time Latest Ref Range: 11.6-15.2 seconds 14.5  INR Latest Ref Range: 0.00-1.49  1.11  APTT Latest Ref Range: 24-37 seconds 24  Sample Expiration Unknown 02/08/2015  Antibody Screen Unknown NEG  ABO/RH(D) Unknown O POS    U/s: see formal report- 4.5cm EMS with cystic changes and flow c/w retained POCs.  MAU Course  Procedures  Methergine 0.2 mg IM IVF bolus  Assessment and Plan  Delayed PPH ABL anemia  Consult with Dr. Ronita Hipps on A/P  Care take over by [facility] secondary to retained POC's. Plan for D&C ini OR under u/s guidance. Risks of bleedings, infection, damage to surrounding organs d/w pt. Specifics risks of uterine perforation and damage to internal organs, need for Xlap and hyst in worst case scenario reviewed with pt. Pt agrees to proceed. -  Anemia. Likely to equilibrate lower than she is now. Will plan 2 units pRBC post-procedure and continue methergine to prevent atony  Shaneca Orne A. 02/05/2015 2:21 PM    BHAMBRI, MELANIE, N 02/05/2015, 11:08 AM

## 2015-02-06 ENCOUNTER — Encounter (HOSPITAL_COMMUNITY): Payer: Self-pay | Admitting: Obstetrics

## 2015-02-06 LAB — TYPE AND SCREEN
ABO/RH(D): O POS
ANTIBODY SCREEN: NEGATIVE
Unit division: 0
Unit division: 0
